# Patient Record
Sex: Female | Born: 1965 | Race: Black or African American | Hispanic: No | Marital: Married | Smoking: Never smoker
Health system: Southern US, Community
[De-identification: ages and names within clinical notes are randomized; demographics above are authoritative.]

## PROBLEM LIST (undated history)

## (undated) DIAGNOSIS — IMO0002 Reserved for concepts with insufficient information to code with codable children: Secondary | ICD-10-CM

## (undated) DIAGNOSIS — R87619 Unspecified abnormal cytological findings in specimens from cervix uteri: Secondary | ICD-10-CM

## (undated) DIAGNOSIS — K754 Autoimmune hepatitis: Secondary | ICD-10-CM

## (undated) DIAGNOSIS — K3184 Gastroparesis: Secondary | ICD-10-CM

## (undated) DIAGNOSIS — I10 Essential (primary) hypertension: Secondary | ICD-10-CM

## (undated) DIAGNOSIS — E079 Disorder of thyroid, unspecified: Secondary | ICD-10-CM

## (undated) HISTORY — DX: Reserved for concepts with insufficient information to code with codable children: IMO0002

## (undated) HISTORY — PX: HERNIA REPAIR: SHX51

## (undated) HISTORY — DX: Unspecified abnormal cytological findings in specimens from cervix uteri: R87.619

## (undated) HISTORY — PX: MYOMECTOMY: SHX85

## (undated) HISTORY — PX: COLONOSCOPY: SHX174

## (undated) HISTORY — PX: THROAT SURGERY: SHX803

## (undated) HISTORY — PX: KNEE SURGERY: SHX244

## (undated) HISTORY — PX: JOINT REPLACEMENT: SHX530

---

## 1999-04-21 ENCOUNTER — Other Ambulatory Visit: Admission: RE | Admit: 1999-04-21 | Discharge: 1999-04-21 | Payer: Self-pay | Admitting: Obstetrics and Gynecology

## 1999-04-22 ENCOUNTER — Encounter (INDEPENDENT_AMBULATORY_CARE_PROVIDER_SITE_OTHER): Payer: Self-pay | Admitting: Specialist

## 1999-04-22 ENCOUNTER — Other Ambulatory Visit: Admission: RE | Admit: 1999-04-22 | Discharge: 1999-04-22 | Payer: Self-pay | Admitting: Obstetrics and Gynecology

## 2000-01-01 ENCOUNTER — Encounter: Payer: Self-pay | Admitting: Emergency Medicine

## 2000-01-01 ENCOUNTER — Emergency Department (HOSPITAL_COMMUNITY): Admission: EM | Admit: 2000-01-01 | Discharge: 2000-01-01 | Payer: Self-pay | Admitting: Emergency Medicine

## 2000-02-06 ENCOUNTER — Inpatient Hospital Stay (HOSPITAL_COMMUNITY): Admission: RE | Admit: 2000-02-06 | Discharge: 2000-02-08 | Payer: Self-pay | Admitting: Obstetrics and Gynecology

## 2000-02-06 ENCOUNTER — Encounter (INDEPENDENT_AMBULATORY_CARE_PROVIDER_SITE_OTHER): Payer: Self-pay | Admitting: Specialist

## 2000-08-10 ENCOUNTER — Encounter (HOSPITAL_BASED_OUTPATIENT_CLINIC_OR_DEPARTMENT_OTHER): Payer: Self-pay | Admitting: General Surgery

## 2000-08-12 ENCOUNTER — Encounter (INDEPENDENT_AMBULATORY_CARE_PROVIDER_SITE_OTHER): Payer: Self-pay | Admitting: *Deleted

## 2000-08-12 ENCOUNTER — Ambulatory Visit (HOSPITAL_COMMUNITY): Admission: RE | Admit: 2000-08-12 | Discharge: 2000-08-12 | Payer: Self-pay | Admitting: General Surgery

## 2000-08-16 ENCOUNTER — Emergency Department (HOSPITAL_COMMUNITY): Admission: EM | Admit: 2000-08-16 | Discharge: 2000-08-16 | Payer: Self-pay | Admitting: Emergency Medicine

## 2000-12-10 ENCOUNTER — Other Ambulatory Visit: Admission: RE | Admit: 2000-12-10 | Discharge: 2000-12-10 | Payer: Self-pay | Admitting: Obstetrics and Gynecology

## 2001-12-12 ENCOUNTER — Other Ambulatory Visit: Admission: RE | Admit: 2001-12-12 | Discharge: 2001-12-12 | Payer: Self-pay | Admitting: Obstetrics and Gynecology

## 2002-12-18 ENCOUNTER — Other Ambulatory Visit: Admission: RE | Admit: 2002-12-18 | Discharge: 2002-12-18 | Payer: Self-pay | Admitting: Obstetrics and Gynecology

## 2003-05-14 ENCOUNTER — Encounter: Admission: RE | Admit: 2003-05-14 | Discharge: 2003-05-14 | Payer: Self-pay | Admitting: Obstetrics and Gynecology

## 2003-12-19 ENCOUNTER — Other Ambulatory Visit: Admission: RE | Admit: 2003-12-19 | Discharge: 2003-12-19 | Payer: Self-pay | Admitting: Obstetrics and Gynecology

## 2004-05-14 ENCOUNTER — Encounter: Admission: RE | Admit: 2004-05-14 | Discharge: 2004-05-14 | Payer: Self-pay | Admitting: Obstetrics and Gynecology

## 2004-05-21 ENCOUNTER — Encounter: Admission: RE | Admit: 2004-05-21 | Discharge: 2004-05-21 | Payer: Self-pay | Admitting: Obstetrics and Gynecology

## 2004-09-05 ENCOUNTER — Inpatient Hospital Stay (HOSPITAL_COMMUNITY): Admission: AD | Admit: 2004-09-05 | Discharge: 2004-09-05 | Payer: Self-pay | Admitting: Obstetrics and Gynecology

## 2004-12-24 ENCOUNTER — Other Ambulatory Visit: Admission: RE | Admit: 2004-12-24 | Discharge: 2004-12-24 | Payer: Self-pay | Admitting: Obstetrics and Gynecology

## 2005-01-01 ENCOUNTER — Encounter: Admission: RE | Admit: 2005-01-01 | Discharge: 2005-01-01 | Payer: Self-pay | Admitting: Obstetrics and Gynecology

## 2005-05-18 ENCOUNTER — Encounter: Admission: RE | Admit: 2005-05-18 | Discharge: 2005-05-18 | Payer: Self-pay | Admitting: Obstetrics and Gynecology

## 2005-11-03 ENCOUNTER — Ambulatory Visit: Payer: Self-pay | Admitting: Gastroenterology

## 2005-11-03 DIAGNOSIS — K219 Gastro-esophageal reflux disease without esophagitis: Secondary | ICD-10-CM

## 2005-11-12 ENCOUNTER — Ambulatory Visit: Payer: Self-pay | Admitting: Gastroenterology

## 2005-12-28 ENCOUNTER — Other Ambulatory Visit: Admission: RE | Admit: 2005-12-28 | Discharge: 2005-12-28 | Payer: Self-pay | Admitting: Obstetrics and Gynecology

## 2006-05-20 ENCOUNTER — Encounter: Admission: RE | Admit: 2006-05-20 | Discharge: 2006-05-20 | Payer: Self-pay | Admitting: Obstetrics and Gynecology

## 2006-10-09 ENCOUNTER — Ambulatory Visit (HOSPITAL_COMMUNITY): Admission: RE | Admit: 2006-10-09 | Discharge: 2006-10-09 | Payer: Self-pay | Admitting: Obstetrics and Gynecology

## 2006-10-09 ENCOUNTER — Encounter (INDEPENDENT_AMBULATORY_CARE_PROVIDER_SITE_OTHER): Payer: Self-pay | Admitting: Obstetrics and Gynecology

## 2006-11-30 ENCOUNTER — Ambulatory Visit: Payer: Self-pay

## 2006-12-29 ENCOUNTER — Ambulatory Visit: Payer: Self-pay | Admitting: Gastroenterology

## 2007-01-14 ENCOUNTER — Ambulatory Visit (HOSPITAL_COMMUNITY): Admission: RE | Admit: 2007-01-14 | Discharge: 2007-01-14 | Payer: Self-pay | Admitting: Gastroenterology

## 2007-01-14 ENCOUNTER — Encounter: Payer: Self-pay | Admitting: Gastroenterology

## 2007-01-20 ENCOUNTER — Ambulatory Visit: Payer: Self-pay | Admitting: Gastroenterology

## 2007-02-02 ENCOUNTER — Ambulatory Visit (HOSPITAL_COMMUNITY): Admission: RE | Admit: 2007-02-02 | Discharge: 2007-02-02 | Payer: Self-pay | Admitting: Gastroenterology

## 2007-03-11 ENCOUNTER — Ambulatory Visit (HOSPITAL_COMMUNITY): Admission: RE | Admit: 2007-03-11 | Discharge: 2007-03-11 | Payer: Self-pay | Admitting: Obstetrics and Gynecology

## 2007-03-11 ENCOUNTER — Encounter (INDEPENDENT_AMBULATORY_CARE_PROVIDER_SITE_OTHER): Payer: Self-pay | Admitting: Obstetrics and Gynecology

## 2007-03-14 ENCOUNTER — Ambulatory Visit: Payer: Self-pay | Admitting: Gastroenterology

## 2007-04-03 ENCOUNTER — Encounter: Admission: RE | Admit: 2007-04-03 | Discharge: 2007-04-03 | Payer: Self-pay | Admitting: Obstetrics and Gynecology

## 2007-04-28 ENCOUNTER — Ambulatory Visit: Payer: Self-pay | Admitting: Gastroenterology

## 2007-05-05 ENCOUNTER — Ambulatory Visit (HOSPITAL_COMMUNITY): Admission: RE | Admit: 2007-05-05 | Discharge: 2007-05-05 | Payer: Self-pay | Admitting: Gastroenterology

## 2007-05-12 DIAGNOSIS — E669 Obesity, unspecified: Secondary | ICD-10-CM | POA: Insufficient documentation

## 2007-05-23 ENCOUNTER — Encounter: Admission: RE | Admit: 2007-05-23 | Discharge: 2007-05-23 | Payer: Self-pay | Admitting: Obstetrics and Gynecology

## 2008-05-23 ENCOUNTER — Encounter: Admission: RE | Admit: 2008-05-23 | Discharge: 2008-05-23 | Payer: Self-pay | Admitting: Obstetrics and Gynecology

## 2008-06-01 ENCOUNTER — Telehealth: Payer: Self-pay | Admitting: Gastroenterology

## 2008-07-26 ENCOUNTER — Ambulatory Visit: Payer: Self-pay | Admitting: Gastroenterology

## 2008-07-26 DIAGNOSIS — K3184 Gastroparesis: Secondary | ICD-10-CM

## 2009-05-24 ENCOUNTER — Encounter: Admission: RE | Admit: 2009-05-24 | Discharge: 2009-05-24 | Payer: Self-pay | Admitting: Obstetrics and Gynecology

## 2010-04-18 ENCOUNTER — Other Ambulatory Visit: Payer: Self-pay | Admitting: Obstetrics and Gynecology

## 2010-04-18 DIAGNOSIS — Z1231 Encounter for screening mammogram for malignant neoplasm of breast: Secondary | ICD-10-CM

## 2010-05-26 ENCOUNTER — Ambulatory Visit
Admission: RE | Admit: 2010-05-26 | Discharge: 2010-05-26 | Disposition: A | Discharge status: Home / Self Care | Payer: Managed Care, Other (non HMO) | Source: Ambulatory Visit | Attending: Obstetrics and Gynecology | Admitting: Obstetrics and Gynecology

## 2010-05-26 DIAGNOSIS — Z1231 Encounter for screening mammogram for malignant neoplasm of breast: Secondary | ICD-10-CM

## 2010-07-22 NOTE — Assessment & Plan Note (Signed)
[facility]                         GASTROENTEROLOGY OFFICE NOTE   NAME:Privette, GLADIES SOFRANKO                        MRN:          [id number]  DATE:04/28/2007                            DOB:          1966/02/21    PROBLEM:  Nausea.   Ms. Lapierre has returned for scheduled followup.  She continues to complain  of nausea, despite erythromycin 500 mg twice a day.  She was unable to  use her Reglan because of lactation while on the mediation.  She  initially had some stomach discomfort with erythromycin, but this has  subsided.  Nausea is fairly constant and she has lost 7 pounds since her  last visit, 6 weeks ago.   PHYSICAL EXAMINATION:  VITAL SIGNS:  Pulse is 68, blood pressure 140/96,  weight 224.   IMPRESSION:  Idiopathic gastroparesis.   RECOMMENDATIONS:  1. Gastroparesis diet.  2. Repeat gastric emptying scan on erythromycin.  3. Try switching erythromycin to 250 mg 4 times a day.  4. Domperidone was discussed, but the patient is reticent to take this      because of a possible side effect of lactation.     Barbette Hair. Arlyce Dice, MD,FACG  Electronically Signed    RDK/MedQ  DD: 04/28/2007  DT: 04/28/2007  Job #: [id number]   cc:   Sigmund Hazel, M.D.

## 2010-07-22 NOTE — Assessment & Plan Note (Signed)
[facility]                         GASTROENTEROLOGY OFFICE NOTE   NAME:Rachel [name], Rachel [name]                        MRN:          [id number]  DATE:12/29/2006                            DOB:          Jan 11, 1966    PROBLEM:  Dysphagia.   The patient has returned for reevaluation.  Over the last few days, she  has developed recurrent dysphagia to solids.  She has a feeling of spasm  in her chest when this occurs as well with pressure in her upper  abdomen.  She denies pyrosis.  She remains on Nexium.  She was dilated  to 18 mm 13 months ago.   PHYSICAL EXAMINATION:  VITAL SIGNS:  Pulse 100, blood pressure 158/88,  weight 228.   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   RECOMMENDATIONS:  1. Repeat dilatation.  We will utilize a balloon dilatation at this      time.  2. Continue on Nexium.     Barbette Hair. Arlyce Dice, MD,FACG  Electronically Signed    RDK/MedQ  DD: 12/29/2006  DT: 12/30/2006  Job #: [id number]   cc:   Sigmund Hazel, M.D.

## 2010-07-22 NOTE — H&P (Signed)
Rachel [name], Rachel [name]                 ACCOUNT NO.:  [id number]   MEDICAL RECORD NO.:  [id number]          PATIENT TYPE:  AMB   LOCATION:  SDC                           FACILITY:  [facility]   PHYSICIAN:  Janine Limbo, M.D.DATE OF BIRTH:  01-31-1966   DATE OF ADMISSION:  10/09/2006  DATE OF DISCHARGE:                              HISTORY & PHYSICAL   Ms. Oravec is a 45 year old female, gravida 2, para 0-0-1-0, who presents  with a first trimester miscarriage.  She has decreasing quantitative HCG  values, and she has an ultrasound that shows a collapsing gestational  sac.   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   DRUG ALLERGIES:  PERCOCET.   PAST MEDICAL HISTORY:  The patient had an abdominal myomectomy in 2001.  She has asthma and currently uses an inhaler.  She has a history of the  human papilloma virus.   SOCIAL HISTORY:  The patient denies cigarette use, alcohol use, and  recreational drug use.   REVIEW OF SYSTEMS:  Noncontributory.   FAMILY HISTORY:  Noncontributory.   PHYSICAL EXAMINATION:  Weight is 235 pounds.  Height is 5 feet 7 inches.  HEENT:  Within normal limits.  CHEST:  Clear.  HEART:  Regular rate and rhythm.  ABDOMEN:  Nontender.  EXTREMITIES:  Grossly normal.  NEUROLOGIC:  Grossly normal.  PELVIC:  External genitalia is normal.  Vagina is normal.  Cervix is  nontender.  Uterus is normal size, shape, and consistency.  Adnexa, no  masses.   LABORATORY VALUES:  Blood type is O+.   ASSESSMENT:  First trimester missed abortion.   PLAN:  The patient will undergo a suction dilatation and evacuation.  She understands the indications for her procedure, and she accepts the  risks of but not limited to anesthetic complications, bleeding,  infection, and possible damage to the surrounding organs.      Janine Limbo, M.D.  Electronically Signed     AVS/MEDQ  D:  10/08/2006  T:  10/08/2006  Job:  [id number]

## 2010-07-22 NOTE — H&P (Signed)
NAMEROSANA, Rachel [name]                 ACCOUNT NO.:  [id number]   MEDICAL RECORD NO.:  [id number]          PATIENT TYPE:  AMB   LOCATION:  SDC                           [facility]ILITY[facility]  [facility]   PHYSICIAN:  Janine Limbo, M.D.DATE OF BIRTH:  11-13-1965   DATE OF ADMISSION:  03/11/2007  DATE OF DISCHARGE:                              HISTORY & PHYSICAL   HISTORY OF PRESENT ILLNESS:  Rachel [name] is a 45 year old female, P0-0-2-0  who presents for hysteroscopy with resection.  She will also have a  dilatation and curettage.  The patient has been followed at the [facility].  The patient  complains of irregular bleeding.  A hydrosonogram was performed and it  showed a uterus that measured 10.67 x 6.86-cm.  Multiple fibroids were  noted measuring less than 5-cm in size.  The endometrium measured 0.62  cm.  The patient was found to have a 1.3 x 1.1-cm mass in the  endometrium that projected into the endometrial cavity.  This was  thought to be either a fibroid or a polyp.  The the patient desires to  have a child.  She had a miscarriage in August 2008.   PAST OBSTETRICAL HISTORY:  In July 2006, the patient had an elective  pregnancy termination at 25 weeks' gestation because she was found to  have an infant with anomalies.  In August 2008 the patient had a first-  trimester miscarriage.   ALLERGIES:  PERCOCET.   PAST MEDICAL HISTORY:  1. Myomectomy in 2001.  2. History of asthma and she currently uses an inhaler.  3. Questionable history of hypertension.   SOCIAL HISTORY:  The patient denies cigarette use, alcohol use and  recreational drug use.   REVIEW OF SYSTEMS:  Noncontributory.   FAMILY HISTORY:  Noncontributory.   PHYSICAL EXAMINATION:  VITAL SIGNS:  Weight is 235 pounds.  Height is 5  feet 7 inches.  HEENT:  Within normal limits.  CHEST:  Chest is clear.  HEART:  Regular rate and rhythm.  ABDOMEN:  Nontender.  EXTREMITIES:   Grossly normal.  NEUROLOGIC:  Grossly normal.  PELVIC:  External genitalia is normal.  Vagina is normal.  Cervix is  nontender.  Uterus is upper limits normal size.  Adnexa no masses.   ASSESSMENT:  1. Irregular bleeding.  2. Fibroid uterus.  3. Endometrial mass consistent with a polyp or a fibroid.   PLAN:  The patient will undergo hysteroscopy with dilatation and  curettage.  We will also resect the mass.  The patient understands the  indications for her surgical procedure and she accepts the risks of, but  not limited to, anesthetic complications, bleeding, infections, and  possible damage to the surrounding organs.      Janine Limbo, M.D.  Electronically Signed     AVS/MEDQ  D:  03/06/2007  T:  03/07/2007  Job:  [id number]

## 2010-07-22 NOTE — Op Note (Signed)
NAMEJAMYLA, Rachel [name]                 ACCOUNT NO.:  [id number]   MEDICAL RECORD NO.:  [id number]          PATIENT TYPE:  AMB   LOCATION:  SDC                           [facility]ILITY:  [facility]   PHYSICIAN:  Janine Limbo, M.D.DATE OF BIRTH:  1966-02-23   DATE OF PROCEDURE:  03/11/2007  DATE OF DISCHARGE:                               OPERATIVE REPORT   PREOPERATIVE DIAGNOSES:  1. Irregular uterine bleeding.  2. Fibroid uterus.  3. Endometrial mass.   POSTOPERATIVE DIAGNOSES:  1. Irregular uterine bleeding.  2. Fibroid uterus.  3. Submucosal fibroids.   PROCEDURES:  1. Hysteroscopy.  2. Hysteroscopic resection of fibroids.  3. Dilatation and curettage.   SURGEON:  Janine Limbo, M.D.   FIRST ASSISTANT:  None.   ANESTHETIC:  Monitored anesthetic control and local 0.5% Marcaine with  epinephrine.   DISPOSITION:  Rachel [name] is a 45 year old female, para 0-0-2-0, who  presents with the above-mentioned diagnoses.  She understands the  indications for her surgical procedure and she accepts the risks of, but  not limited to, anesthetic complications, bleeding, infections, and  possible damage to the surrounding organs.   FINDINGS:  The uterus is approximately 10 weeks' size and irregular.  On  hysteroscopy the patient was found to have two submucosal fibroids with  the largest submucosal fibroid measuring approximately 1.75 cm in size.  No other pathology was noted.   PROCEDURE:  The patient was taken to the operating room, where she was  given medication through her IV line.  The patient's lower abdomen,  perineum, and vagina were prepped with multiple layers of Betadine.  The  bladder was drained of urine.  Examination under anesthesia was  performed.  The patient was sterilely draped.  A paracervical block was  placed using 10 mL of 0.5% Marcaine with epinephrine.  An endocervical  curettage was performed.  The uterus sounded to 8.5 cm.  The cervix was  gently dilated.  The  operative hysteroscope was inserted and the cavity  was carefully inspected.  Pictures were taken..  We used a single loop  to resect the two submucosal fibroids.  We then curetted the uterine  cavity until it was felt to be clean.  The cavity was then explored  using small ring forceps to remove all fragments of fibroids.  The  hysteroscope was reinserted and the cavity was carefully inspected.  The  cavity was felt to be clean at the end of our procedure.  Hemostasis was  noted to be adequate.  The patient was given Toradol 30 mg IV and 30 mg  IM.  Sponge, needle, and instrument counts were correct on two  occasions.  The estimated blood loss for the procedure was 20 mL.  The  estimated fluid deficit was 675 mL, but in the end there was noted to be  approximately 150-200 mL of fluid on the operating room floor.  The  patient was awakened from her anesthetic without difficulty and then  taken to the recovery room in stable condition.  She tolerated her  procedure well.  The endocervical curettage,  the endometrial resections,  and the endometrial curettings were sent to pathology for evaluation.   DISCHARGE MEDICATIONS:  The patient will take the medication she was on  at home.  She will also take Motrin 800 mg every 8 hours as needed for  mild to moderate pain.  She will take Darvocet-N 100 one tablet every 4-  6 hours as needed for severe pain.  She will take potassium chloride 20  mEq each day for 4 days.  Her preoperative potassium was 3.1.  She was  given a copy of the postoperative instruction sheet as prepared by the  [facility] for patients who have undergone a  dilatation and curettage.      Janine Limbo, M.D.  Electronically Signed     AVS/MEDQ  D:  03/11/2007  T:  03/11/2007  Job:  [id number]

## 2010-07-22 NOTE — Assessment & Plan Note (Signed)
[facility]                         GASTROENTEROLOGY OFFICE NOTE   NAME:[name], Rachel BRENNER                        MRN:          [id number]  DATE:03/14/2007                            DOB:          10/15/1965    PROBLEM:  Gastroparesis.   Rachel [name] has returned for scheduled GI followup.  A distal esophageal  stricture was dilated to 18 mm.  Since that time she has had no further  dysphagia.  She has had documented gastroparesis by gastric emptying  scan.  Since starting Reglan she notes marked improvement in her  fullness.  She still is intolerant of popcorn and sodas.  Also again  though she is feeling quite improved.   PHYSICAL EXAMINATION:  VITAL SIGNS:  Pulse 112, blood pressure 154/90,  weight 231.   IMPRESSION:  1. Distal esophageal stricture - asymptomatic following dilatation      therapy.  2. Gastroparesis.   RECOMMENDATIONS:  Continue Reglan.  Her diet is significantly restricted  by certain foods.  Due to her gastroparesis I will repeat the scan to  see whether she still has delayed emptying.  If so then will increase  her Reglan to 20 mg.     Barbette Hair. Arlyce Dice, MD,FACG  Electronically Signed    RDK/MedQ  DD: 03/14/2007  DT: 03/14/2007  Job #: [id number]   cc:   Sigmund Hazel, M.D.

## 2010-07-22 NOTE — Op Note (Signed)
[name], [name]                 ACCOUNT NO.:  [id number]   MEDICAL RECORD NO.:  [id number]          PATIENT TYPE:  AMB   LOCATION:  SDC                           [facility]ILITY:  [facility]   PHYSICIAN:  Janine Limbo, M.D.DATE OF BIRTH:  10/20/1965   DATE OF PROCEDURE:  10/09/2006  DATE OF DISCHARGE:                               OPERATIVE REPORT   PREOPERATIVE DIAGNOSIS:  1. First trimester missed abortion.  This  2. Hypertension.  3. Asthma.  4. Gastroesophageal reflux disease   POSTOPERATIVE DIAGNOSIS:  1. First trimester missed abortion.  This  2. Hypertension.  3. Asthma.  4. Gastroesophageal reflux disease   PROCEDURE:  Suction dilatation and evacuation.   SURGEON:  Janine Limbo, M.D.   FIRST ASSISTANT:  None.   ANESTHESIA:  Monitored anesthetic control and paracervical block using  0.5% Marcaine with epinephrine.   DISPOSITION:  Rachel [name] is a 45 year old female, gravida 2, para 0-0-1-0,  who presents with a missed abortion.  She has decreasing quantitative  HCG values and a nonviable infant on ultrasound.  The patient  understands the indications for her surgical procedure and she accepts  the risks of, but not limited to, anesthetic complications, bleeding,  infection, and possible damage to surrounding organs.  The patient  desires chromosomal analysis.   FINDINGS:  The patient's blood type is O positive.  The patient had a 10-  12 weeks size uterus and a small amount of products of conception were  removed from within the uterine cavity.   PROCEDURE IN DETAIL:  The patient was taken to the operating room where  she was given medication through her IV line.  The patient's perineum  and vagina were prepped with multiple layers of Betadine.  The bladder  was drained of urine.  Examination under anesthesia was performed.  The  patient was sterilely draped.  A paracervical block was placed using 10  mL of 0.5% Marcaine with epinephrine.  An additional 10 mL of  0.5%  Marcaine with epinephrine were injected directly into the cervix.  The  uterus sounded to 13 cm.  The cervix was gradually dilated.  The uterine  cavity was evacuated using a size 8 suction curet followed by a medium  sharp curet.  The cavity was felt to be clean at end of our procedure.  Hemostasis was noted to be adequate.  All instruments were removed.  Repeat examination showed a firm uterus approximately 10-12 weeks' in  size.  The patient tolerated her procedure well.  She was taken to the  recovery room in stable condition.   FOLLOW UP INSTRUCTIONS:  The patient was given a prescription for  ibuprofen and she will take 600 mg every 6 hours as needed for mild to  moderate pain.  She will take Darvocet 1 tablet every 4 hours as needed  for severe pain.  She will return to see Dr. Stefano Gaul in 2-3 weeks for  follow up examination.  She was given a copy of the postoperative  instruction sheet as prepared by the [facility] for  patients  who have undergone a dilatation and curettage.  The products of  conception were sent to pathology for evaluation and a sample was sent  for chromosomal analysis, as well.      Janine Limbo, M.D.  Electronically Signed     AVS/MEDQ  D:  10/09/2006  T:  10/09/2006  Job:  [id number]

## 2010-07-25 NOTE — Letter (Signed)
November 03, 2005     Ms. [facility]  [address]  [place], [place]  [address]   RE:  SIMMIE, GARIN  MRN:  [id number]  /  DOB:  23-Oct-1965   Dear Ms. Colleran:   It is my pleasure to have treated you recently as a new patient in my  office.  I appreciate your confidence and the opportunity to participate in  your care.   Since I do have a busy inpatient endoscopy schedule and office schedule, my  office hours vary weekly.  I am, however, available for emergency calls  every day through my office.  If I cannot promptly meet an urgent office  appointment, another one of our gastroenterologists will be able to assist  you.   My well-trained staff are prepared to help you at all times.  For  emergencies after office hours, a physician from our gastroenterology  section is always available through my 24-hour answering service.   While you are under my care, I encourage discussion of your questions and  concerns, and I will be happy to return your calls as soon as I am  available.   Once again, I welcome you as a new patient and I look forward to a happy and  healthy relationship.   Sincerely,     Barbette Hair. Arlyce Dice, MD, [facility]   RDK/MedQ  DD:  11/03/2005 DT:  11/03/2005 Job #:  [id number]

## 2010-07-25 NOTE — Discharge Summary (Signed)
[facility]  Patient:    Rachel [name], Rachel [name]                       MRN: [id number] Adm. Date:  [id number] Disc. Date: [id number] Attending:  Leonard Schwartz                           Discharge Summary  DISCHARGE DIAGNOSES:          1. Twenty-week fibroid uterus.                               2. Menorrhagia.                               3. Postoperative anemia.                               4. Asthma.  PROCEDURES:                   February 06, 2000:  Multiple abdominal myomectomy.  HISTORY OF PRESENT ILLNESS:   [name] is a 45 year old female, para 0, who presents with a fibroid uterus that was noted to be 15-week size in 2000 and then confirmed to be 20-week size in 2001. Please see her dictated history and physical exam for details.  PAST MEDICAL HISTORY:         The patient has asthma and she uses an inhaler as needed.  PHYSICAL EXAMINATION:         The patient was noted to have a 20-week size irregular firm uterus.  HOSPITAL COURSE:              On the day of admission the patient underwent a multiple abdominal myomectomy. Operative findings included a 20-week sized fibroid uterus. A total of 10 fibroids were removed and the largest fibroid measured 10 cm in diameter. The fallopian tubes and the ovaries were normal. The estimated blood loss during the surgery was 500 cc. The patient tolerated her surgery well. Her postoperative hemoglobin was 10.3 (preoperative hemoglobin 12.8). The patient quickly tolerated a regular diet. She remained afebrile throughout her postoperative stay. By postoperative day #2 she was felt to be ready for discharge.  DISCHARGE INSTRUCTIONS:       The patient will return to see Dr. Stefano Gaul in six weeks for follow-up examination. She will refrain from driving for two weeks, heavy lifting for four weeks, and intercourse for six weeks. She will call for questions or concerns. She will call for temperature  greater than 101. She was given a copy of the postoperative instruction sheet as prepared per [facility].  FINAL PATHOLOGY REPORT:       Pending. DD:  02/08/00 TD:  02/08/00 Job: [id number]

## 2010-07-25 NOTE — Op Note (Signed)
[place]  Patient:    MARGUERETTE, SHELLER                       MRN: [id number] Proc. Date: 08/12/00 Adm. Date:  [id number] Attending:  Sonda Primes                           Operative Report  PREOPERATIVE DIAGNOSIS:  Ventral (incisional) hernia.  POSTOPERATIVE DIAGNOSIS:  Ventral (incisional) hernia.  PROCEDURE:  Repair of incisional hernia with mesh.  SURGEON:  Mardene Celeste. Lurene Shadow, M.D.  ASSISTANT:  Nurse.  ANESTHESIA:  General.  CLINICAL NOTE:  The patient is a 45 year old woman status post previous lower abdominal surgery, who presents now with a painful and enlarging mass inferior to and extending up to the region of the umbilicus, which on examination is a ventral hernia.  She is brought to the operating room now for repair.  DESCRIPTION OF PROCEDURE:  Following the induction of satisfactory general anesthesia with the patient positioned supinely, the abdomen is routinely prepped and draped to be included in a sterile operative field.  I made an incision through the old scar and cicatrix, extending up to and above the umbilicus.  This was deepened through the subcutaneous tissue, carrying the dissection down to the hernia sac, which was freed up on all sides.  It went to the right lateral aspect of the umbilicus, requiring [place] to elevate the umbilicus so as to get to the fascia on that side.  The entire hernia sac was dissected free, opened, and the contents within the hernia sac were reduced into the peritoneal cavity.  I used a Seprafilm slurry to separate the viscera from the mesh, and then I used a large mesh plug to fill the defect.  This was sewn into the fascia with a running suture of #1 Novofil, carried around the entire fascial edge.  The repair was noted to be intact.  The sponge, instrument, and sharp counts were verified.  Subcutaneous tissues were reapproximated with interrupted sutures of 2-0 Vicryl, and the skin  was closed with a running 4-0 Monocryl suture and then reinforced with Steri-Strips. Sterile dressings were applied.  Anesthetic reversed.  Patient removed from the operating room to the recovery room in stable condition, having tolerated this procedure well. DD:  08/12/00 TD:  08/12/00 Job: [id number]

## 2010-07-25 NOTE — Op Note (Signed)
[facility]  Patient:    Rachel [name], Rachel [name]                       MRN: [id number] Proc. Date: 02/06/00 Adm. Date:  [id number] Attending:  Leonard Schwartz                           Operative Report  PREOPERATIVE DIAGNOSES:       1. Enlarging fibroids - 20 weeks.                               2. Menorrhagia.  POSTOPERATIVE DIAGNOSES:      1. Enlarging fibroids - 20 weeks.                               2. Menorrhagia.  OPERATION:                    Abdominal myomectomy.  SURGEON:                      Janine Limbo, M.D.  FIRST ASSISTANT:              Henreitta Leber, P.A.  ANESTHESIA:                   General.  DISPOSITION:                  The patient is a 45 year old female, gravida 0, who presents with a rapidly enlarging fibroid uterus.  Her uterus was noted to be _______ size one year ago, and now ultrasound confirms a 20-week size uterus.  The patient understands the indications for her procedure, and she accepts the risks of, but not limited to anesthetic complications, bleeding, infections, and possible damage to the surrounding organs.  FINDINGS:                     The uterus was noted to be 20 week size and there were multiple fibroids present.  A total of 10 fibroids were removed. The larger fibroid measured 10 cm in diameter.  The fallopian tubes and ovaries appeared normal.  The fallopian tubes were delicate at the fimbriated end.  DESCRIPTION OF PROCEDURE:     The patient was taken to the operating room where a general anesthetic was given.  The patients abdomen, perineum, and vagina were prepped with multiple layers of Betadine.  Examination under anesthesia was performed.  A Foley catheter was placed in the bladder.  The patient was sterilely draped.  A midline incision was made and carried sharply through the subcutaneous tissue, the fascia, and the anterior peritoneum.  The incision was extended to the left of the  umbilicus.  The uterus was elevated into our operative field.  The largest of the fibroids were injected with Pitressin and saline.  Each fibroid was then subsequently incised on the serosal surface and then removed using a combination of sharp and blunt dissection.  ________ in the uterus were closed using deep sutures of 0 Vicryl and then the serosa was closed using a running suture of 3-0 Vicryl. The largest fibroid was present in the fundus.  Several fibroids were removed from the anterior portion.  If this patient needs to deliver in the future, we  recommend a cesarean delivery because of the extensive dissection.  Care was taken not to damage the fallopian tubes.  At the end of our procedure, hemostasis was noted to be adequate.  Interceed was then stitched above incisions.  The pelvic cavity was irrigated.  All instruments were removed.  The anterior peritoneum and the abdominal musculature and the fascia were closed using two running sutures of 0 PDS from the corners to the midline.  The subcutaneous layer was irrigated. Hemostasis was adequate.  The subcutaneous layer was closed using 3-0 Vicryl. The skin was reapproximated using skin staples.  Sponge, needle, and instrument counts were correct on two occasions.  The estimated blood loss was 500 cc.  The patient was noted to drain clear yellow urine at the end of her procedure.  Her anesthetic was reversed and the patient was taken to the recovery room in stable condition. DD:  02/08/00 TD:  02/08/00 Job: [id number]

## 2010-07-25 NOTE — H&P (Signed)
[facility]  Patient:    Rachel [name], Rachel [name]                         MRN: [id number] Adm. Date:  [id number] Disc. Date: [id number] Attending:  Shelba Flake                         History and Physical  HISTORY OF PRESENT ILLNESS:   The patient is a 45 year old female, gravida 0, who presents for an abdominal myomectomy. The patient has been followed at [facility] for enlarging fibroids. An ultrasound was performed in October of 2001 which showed a 19.9 x 13.6 cm multifibroid uterus. In 2000, the patients uterus was noted to be 15.2 cm in size. The patient has menorrhagia. Her endometrial biopsy was negative. Her most recent Pap smear of September of 2001 was within normal limits. The patient has taken birth control pills to help to control her symptoms. but they were unsuccessful. The patient has a history of HPV. She denies any other GYN infections. She denies any prior GYN surgery.  PAST MEDICAL HISTORY:         The patient has a history of asthma and she uses an inhaler. She did take prednisone earlier in the month but is no longer taking this medication. She denies hypertension and diabetes.  SOCIAL HISTORY:               The patient works for [employer]. She is single. She drinks alcohol socially. She denies cigarette use and other recreational drug uses.  ALLERGIES:                    No known drug allergies.  FAMILY HISTORY:               The patients mother and sister have diabetes. The patients sister, mother, and brother have hypertension. The patients father had colon cancer.  REVIEW OF SYSTEMS:            The patient wears glasses, has achy joints, and has shortness of breath associated with her asthma.  PHYSICAL EXAMINATION:  VITAL SIGNS:                  Weight is 233 pounds, height is 5 feet 7 inches.  HEENT:                        Within normal limits.  CHEST:                         Clear.  HEART:                        Regular rate and rhythm.  BREASTS:                      Without masses.  BACK:                         Within normal limits.  EXTREMITIES:                  Within normal limits.  NEUROLOGIC:                   Normal.  ABDOMEN:  Nontender and there is a nontender firm mass present to the umbilicus.  PELVIC:                       External genitalia normal. Vagina is normal. Cervix is nontender. The uterus is 20 week size, irregular and firm. Adnexa no masses. Rectovaginal exam confirms.  ASSESSMENT:                   1. Fibroid uterus.                               2. Menorrhagia.  PLAN:                         The patient will undergo an abdominal myomectomy. She understands the indications for her procedure and she accepts the risk of, but not limited to anesthetic complications, bleeding, infections and possible damage to the surrounding organs. DD:  02/05/00 TD:  02/05/00 Job: [id number]

## 2010-07-25 NOTE — Assessment & Plan Note (Signed)
[facility]                           GASTROENTEROLOGY OFFICE NOTE   NAME:Starling, DONNE ROBILLARD                        MRN:          [id number]  DATE:11/03/2005                            DOB:          15-Feb-1966    REASON FOR CONSULTATION:  Dysphagia.   REFERRING PHYSICIAN:  Dr. Maurice March and Patton Salles.   Rachel [name] is a pleasant 45 year old African-American female referred through  the courtesy of Dr. Maurice March and Patton Salles for evaluation.  Over the past  year she has been complaining of dysphagia to solids.  With the dysphagia,  she has some mild chest pain as well.  She has occasional reflux.  She has  been taking Nexium.  She denies sore throat, hoarseness, or cough.   PAST MEDICAL HISTORY:  1. Hypertension.  2. She has asthma.  3. She is status post herniorrhaphy.   FAMILY HISTORY:  Noncontributory.   MEDICATIONS:  Methyldopa, Singulair, Nexium.   ALLERGIES:  PERCOCET.   She neither smokes or drinks.  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   REVIEW OF SYSTEMS:  Positive for joint pains and occasional dyspnea on  exertion.   PHYSICAL EXAMINATION:  GENERAL:  She is a healthy-appearing female.  VITAL SIGNS:  Pulse 80, blood pressure 150/102, weight 217.  HEENT: EOMI. PERRLA. Sclerae are anicteric.  Conjunctivae are pink.  NECK:  Supple without thyromegaly, adenopathy or carotid bruits.  CHEST:  Clear to auscultation and percussion without adventitious sounds.  CARDIAC:  Regular rhythm; normal S1 S2.  There are no murmurs, gallops or  rubs.  ABDOMEN:  Bowel sounds are normoactive.  Abdomen is soft, non-tender and non-  distended.  There are no abdominal masses, tenderness, splenic enlargement  or hepatomegaly.  EXTREMITIES:  Full range of motion.  No cyanosis, clubbing or edema.  RECTAL:  Deferred.   IMPRESSION:  1. Dysphagia likely secondary to a peptic esophageal stricture.  2. Gastroesophageal reflux disease, well  controlled with Nexium.  3. Hypertension.   RECOMMENDATION:  1. Continue Nexium.  2. Upper endoscopy with Savary dilatation.  3. Follow for hypertension with Dr. Hyacinth Meeker.                                   Barbette Hair. Arlyce Dice, MD, [facility]   RDK/MedQ  DD:  11/03/2005  DT:  11/03/2005  Job #:  [id number]   cc:   Sigmund Hazel, MD

## 2010-07-25 NOTE — Letter (Signed)
November 03, 2005     Sigmund Hazel, M.D.  [address], [address]  [place], [place]  [address]   RE:  [name], [name]  MRN:  [id number]  /  DOB:  [birth year]   Dear Dr. Hyacinth Meeker:   Upon your kind referral, I had the pleasure of evaluating your patient and I  am pleased to offer my findings.  I saw [name] in the office today.  Enclosed is a copy of my progress note that details my findings and  recommendations.   Thank you for the opportunity to participate in your patient's care.    Sincerely,      Rachel Hair. Arlyce Dice, MD, [facility]   RDK/MedQ  DD:  11/03/2005  DT:  11/03/2005  Job #:  [id number]

## 2010-12-12 LAB — CBC
HCT: 38.7
MCHC: 33.8
MCV: 86
Platelets: 312
RBC: 4.5
WBC: 9

## 2010-12-12 LAB — URINALYSIS, ROUTINE W REFLEX MICROSCOPIC
Hgb urine dipstick: NEGATIVE
Protein, ur: NEGATIVE
Specific Gravity, Urine: 1.025
pH: 5.5

## 2010-12-12 LAB — BASIC METABOLIC PANEL
BUN: 10
CO2: 27
Chloride: 102

## 2010-12-22 LAB — URINALYSIS, ROUTINE W REFLEX MICROSCOPIC
Bilirubin Urine: NEGATIVE
Glucose, UA: NEGATIVE
Hgb urine dipstick: NEGATIVE
Ketones, ur: NEGATIVE
Protein, ur: NEGATIVE
Urobilinogen, UA: 0.2
pH: 8

## 2010-12-22 LAB — CBC
Hemoglobin: 12.1
Platelets: 291
RBC: 4.18

## 2011-04-21 ENCOUNTER — Other Ambulatory Visit: Payer: Self-pay | Admitting: Obstetrics and Gynecology

## 2011-04-21 DIAGNOSIS — Z1231 Encounter for screening mammogram for malignant neoplasm of breast: Secondary | ICD-10-CM

## 2011-05-27 ENCOUNTER — Ambulatory Visit
Admission: RE | Admit: 2011-05-27 | Discharge: 2011-05-27 | Disposition: A | Discharge status: Home / Self Care | Payer: Private Health Insurance - Indemnity | Source: Ambulatory Visit | Attending: Obstetrics and Gynecology | Admitting: Obstetrics and Gynecology

## 2011-05-27 DIAGNOSIS — Z1231 Encounter for screening mammogram for malignant neoplasm of breast: Secondary | ICD-10-CM

## 2012-01-13 ENCOUNTER — Encounter: Payer: Self-pay | Admitting: Obstetrics and Gynecology

## 2012-01-13 ENCOUNTER — Ambulatory Visit (INDEPENDENT_AMBULATORY_CARE_PROVIDER_SITE_OTHER): Payer: 59 | Admitting: Obstetrics and Gynecology

## 2012-01-13 VITALS — BP 134/82 | Ht 67.0 in | Wt 252.0 lb

## 2012-01-13 DIAGNOSIS — N898 Other specified noninflammatory disorders of vagina: Secondary | ICD-10-CM

## 2012-01-13 DIAGNOSIS — Z01419 Encounter for gynecological examination (general) (routine) without abnormal findings: Secondary | ICD-10-CM

## 2012-01-13 DIAGNOSIS — Z124 Encounter for screening for malignant neoplasm of cervix: Secondary | ICD-10-CM

## 2012-01-13 DIAGNOSIS — L293 Anogenital pruritus, unspecified: Secondary | ICD-10-CM

## 2012-01-13 DIAGNOSIS — IMO0002 Reserved for concepts with insufficient information to code with codable children: Secondary | ICD-10-CM | POA: Insufficient documentation

## 2012-01-13 LAB — POCT WET PREP (WET MOUNT)

## 2012-01-13 MED ORDER — FLUCONAZOLE 150 MG PO TABS: 150.0000 mg | ORAL_TABLET | Freq: Once | ORAL | Status: DC

## 2012-01-13 NOTE — Progress Notes (Signed)
ANNUAL GYNECOLOGIC EXAMINATION   Rachel [name] is a 46 y.o. female, G2P2, who presents for an annual exam. The patient's sister has developed multiple myeloma and multiple sclerosis.  The patient has a history of an ASCUS Pap smear with HPV.  She complains of itching from the vagina.  She complains of menopausal symptoms.   History   Social History  . Marital Status: Married    Spouse Name: N/A    Number of Children: N/A  . Years of Education: N/A   Social History Main Topics  . Smoking status: Never Smoker   . Smokeless tobacco: Never Used  . Alcohol Use: Yes  . Drug Use: No  . Sexually Active: Yes -- Female partner(s)    Birth Control/ Protection: IUD   Other Topics Concern  . None   Social History Narrative  . None    Menstrual cycle:   LMP: No LMP recorded. Patient is not currently having periods (Reason: IUD).             The following portions of the patient's history were reviewed and updated as appropriate: allergies, current medications, past family history, past medical history, past social history, past surgical history and problem list.  Review of Systems Pertinent items are noted in HPI. Breast:Negative for breast lump,nipple discharge or nipple retraction Gastrointestinal: Negative for abdominal pain, change in bowel habits or rectal bleeding Urinary:negative   Objective:    Ht 5\' 7"  (1.702 m)  Wt 252 lb (114.306 kg)  BMI 39.47 kg/m2    Weight:  Wt Readings from Last 1 Encounters:  01/13/12 252 lb (114.306 kg)          BMI: Body mass index is 39.47 kg/(m^2).  General Appearance: Alert, appropriate appearance for age. No acute distress HEENT: Grossly normal Neck / Thyroid: Supple, no masses, nodes or enlargement Lungs: clear to auscultation bilaterally Back: No CVA tenderness Breast Exam: No masses or nodes.No dimpling, nipple retraction or discharge. Cardiovascular: Regular rate and rhythm. S1, S2, no murmur Gastrointestinal: Soft, non-tender, no  masses or organomegaly  ++++++++++++++++++++++++++++++++++++++++++++++++++++++++  Pelvic Exam: External genitalia: normal general appearance Vaginal: normal without tenderness, induration or masses. Relaxation: No.  Thick white discharge present. Cervix: normal appearance Adnexa: normal bimanual exam Uterus: normal size, shape, and consistency Rectovaginal: normal rectal, no masses  ++++++++++++++++++++++++++++++++++++++++++++++++++++++++  Lymphatic Exam: Non-palpable nodes in neck, clavicular, axillary, or inguinal regions Neurologic: Normal speech, no tremor  Psychiatric: Alert and oriented, appropriate affect.   Wet prep: Yeast  Assessment:    Normal gyn exam   Overweight or obese: Yes   Pelvic relaxation: No  Yeast vaginitis  Obesity  Hypertension  History of ascus Pap with HPV  Sister with multiple sclerosis and multiple myeloma   Plan:    mammogram pap smear return annually or prn Contraception:no method    Medications prescribed: Diflucan 150 mg  STD screen request: No   The updated Pap smear screening guidelines were discussed with the patient. The patient requested that I obtain a Pap smear: Yes.  Kegel exercises discussed: No.  Proper diet and regular exercise were reviewed.  Annual mammograms recommended starting at age 13. Proper breast care was discussed.  Screening colonoscopy is recommended beginning at age 22.  Regular health maintenance was reviewed.  Sleep hygiene was discussed.  Adequate calcium and vitamin D intake was emphasized.  Leonard Schwartz M.D.    Regular Periods: no Mammogram: 03.2013 WNL  Monthly Breast Ex.: yes Exercise: yes  Tetanus < 10  years: no Seatbelts: yes  NI. Bladder Functn.: yes Abuse at home: no  Daily BM's: yes Stressful Work: no  Healthy Diet: yes Sigmoid-Colonoscopy: 2013 due 2015  Calcium: yes Medical problems this year: Vaginal Itching.    LAST PAP:05/2010 WNL   Contraception:  Mirena  Mammogram:  05/2011  PCP: Dr.Duran   PMH: No Changes   FMH: sister diagnosed w/ precancerous cells in her blood and MS.   Last Bone Scan: 2012

## 2012-01-15 LAB — PAP IG W/ RFLX HPV ASCU

## 2012-01-26 ENCOUNTER — Encounter: Payer: Self-pay | Admitting: Obstetrics and Gynecology

## 2012-04-04 ENCOUNTER — Telehealth: Payer: Self-pay | Admitting: Obstetrics and Gynecology

## 2012-04-04 NOTE — Telephone Encounter (Signed)
Tc to pt rgd appt request. Pt has appt scheduled with EP on 04/07/12 but would like something sooner. Advised pt that this is the first available but that she can be put on wait list in case there are any cancellations. Pt voiced understanding.

## 2012-04-07 ENCOUNTER — Ambulatory Visit: Payer: Private Health Insurance - Indemnity | Admitting: Obstetrics and Gynecology

## 2012-04-07 ENCOUNTER — Encounter: Payer: Self-pay | Admitting: Obstetrics and Gynecology

## 2012-04-07 VITALS — BP 122/70 | Temp 98.7°F | Wt 241.0 lb

## 2012-04-07 DIAGNOSIS — R350 Frequency of micturition: Secondary | ICD-10-CM

## 2012-04-07 DIAGNOSIS — Z113 Encounter for screening for infections with a predominantly sexual mode of transmission: Secondary | ICD-10-CM

## 2012-04-07 DIAGNOSIS — N898 Other specified noninflammatory disorders of vagina: Secondary | ICD-10-CM

## 2012-04-07 LAB — POCT WET PREP (WET MOUNT)
Whiff Test: NEGATIVE
pH: 4.5
pH: 4.5

## 2012-04-07 LAB — POCT URINALYSIS DIPSTICK: Spec Grav, UA: 1.02

## 2012-04-07 LAB — POCT OSOM TRICHOMONAS RAPID TEST: Trichomonas vaginalis: NEGATIVE

## 2012-04-07 MED ORDER — ALPRAZOLAM 0.25 MG PO TABS: ORAL_TABLET | ORAL | Status: DC

## 2012-04-07 MED ORDER — TERCONAZOLE 0.8 % VA CREA: 1.0000 | TOPICAL_CREAM | Freq: Every day | VAGINAL | Status: DC

## 2012-04-07 NOTE — Progress Notes (Addendum)
47 YO with a history of HPV was treated for yeast with Diflucan in November but returns with complaints of vaginal itching.  Patient has had some vasomotor symptoms and is using a Mirena IUD.  Patient goes on to say that her husband has been unfaithful but she's in counseling. Also reports that her mother passed away around Thanksgiving and is still grieving. Patient denies suicidal or homocidal ideations but is having difficulty with sleeping and panic attacks.  O: Pelvic: EGBUS-negative, vagina-small clear discharge, cervix-string visible, uterus-ULNS, irregular without tenderness, adnexae-no      masses or tenderness  Wet Prep: ph-4.5,  whiff-negative few yeast OSOM-trich = negative  A; STD Testing      Grief      Spousal Infidelity      Anxiety Attacks      Yeast Vaginitis   P: Recommended Grief Counseling through Hospice     Continue marriage counseling via EAP       STD testing       Terazol 3  1 appl pv qhs x 3 nights        Xanax 0.025 mg  #30  1 po tid  prn no refills       RTO-as scheduled or prn  Lige Lakeman, PA-C

## 2012-04-08 LAB — HSV 2 ANTIBODY, IGG: HSV 2 Glycoprotein G Ab, IgG: <0.1 IV

## 2012-04-08 LAB — HSV 1 ANTIBODY, IGG: HSV 1 Glycoprotein G Ab, IgG: 12.19 IV — ABNORMAL HIGH

## 2012-04-11 ENCOUNTER — Telehealth: Payer: Self-pay | Admitting: Obstetrics and Gynecology

## 2012-04-11 NOTE — Telephone Encounter (Signed)
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  Reviewed HSV-1, natural history, transmission and prevalence.  Treatment options available for HSV-1.  Patient has never had a cold sore or related symptoms so declines for now.  Is seeing a counselor for marital discord and states that spouse expressed a willingness to go also.  Rachel Racanelli, PA-C

## 2012-04-19 ENCOUNTER — Other Ambulatory Visit: Payer: Self-pay | Admitting: Obstetrics and Gynecology

## 2012-04-19 DIAGNOSIS — Z1231 Encounter for screening mammogram for malignant neoplasm of breast: Secondary | ICD-10-CM

## 2012-05-27 ENCOUNTER — Ambulatory Visit
Admission: RE | Admit: 2012-05-27 | Discharge: 2012-05-27 | Disposition: A | Discharge status: Home / Self Care | Payer: Private Health Insurance - Indemnity | Source: Ambulatory Visit | Attending: Obstetrics and Gynecology | Admitting: Obstetrics and Gynecology

## 2012-05-27 DIAGNOSIS — Z1231 Encounter for screening mammogram for malignant neoplasm of breast: Secondary | ICD-10-CM

## 2013-04-19 ENCOUNTER — Other Ambulatory Visit: Payer: Self-pay

## 2013-04-19 DIAGNOSIS — Z1231 Encounter for screening mammogram for malignant neoplasm of breast: Secondary | ICD-10-CM

## 2013-05-29 ENCOUNTER — Ambulatory Visit
Admission: RE | Admit: 2013-05-29 | Discharge: 2013-05-29 | Disposition: A | Discharge status: Home / Self Care | Payer: Private Health Insurance - Indemnity | Source: Ambulatory Visit

## 2013-05-29 DIAGNOSIS — Z1231 Encounter for screening mammogram for malignant neoplasm of breast: Secondary | ICD-10-CM

## 2014-01-08 ENCOUNTER — Encounter: Payer: Self-pay | Admitting: Obstetrics and Gynecology

## 2014-03-29 ENCOUNTER — Other Ambulatory Visit: Payer: Self-pay | Admitting: Internal Medicine

## 2014-03-29 DIAGNOSIS — E041 Nontoxic single thyroid nodule: Secondary | ICD-10-CM

## 2014-04-10 ENCOUNTER — Inpatient Hospital Stay
Admission: RE | Admit: 2014-04-10 | Discharge: 2014-04-10 | Disposition: A | Discharge status: Home / Self Care | Payer: Self-pay | Source: Ambulatory Visit | Attending: Interventional Radiology | Admitting: Interventional Radiology

## 2014-04-10 ENCOUNTER — Other Ambulatory Visit (HOSPITAL_COMMUNITY): Payer: Self-pay | Admitting: Interventional Radiology

## 2014-04-10 DIAGNOSIS — E041 Nontoxic single thyroid nodule: Secondary | ICD-10-CM

## 2014-04-11 ENCOUNTER — Ambulatory Visit
Admission: RE | Admit: 2014-04-11 | Discharge: 2014-04-11 | Disposition: A | Discharge status: Home / Self Care | Payer: BLUE CROSS/BLUE SHIELD | Source: Ambulatory Visit | Attending: Internal Medicine | Admitting: Internal Medicine

## 2014-04-11 ENCOUNTER — Encounter (INDEPENDENT_AMBULATORY_CARE_PROVIDER_SITE_OTHER): Payer: Self-pay

## 2014-04-11 ENCOUNTER — Other Ambulatory Visit (HOSPITAL_COMMUNITY)
Admission: RE | Admit: 2014-04-11 | Discharge: 2014-04-11 | Disposition: A | Discharge status: Home / Self Care | Payer: BLUE CROSS/BLUE SHIELD | Source: Ambulatory Visit | Attending: Interventional Radiology | Admitting: Interventional Radiology

## 2014-04-11 DIAGNOSIS — E041 Nontoxic single thyroid nodule: Secondary | ICD-10-CM | POA: Insufficient documentation

## 2014-04-24 ENCOUNTER — Other Ambulatory Visit: Payer: Self-pay

## 2014-04-24 DIAGNOSIS — Z1231 Encounter for screening mammogram for malignant neoplasm of breast: Secondary | ICD-10-CM

## 2014-05-31 ENCOUNTER — Ambulatory Visit
Admission: RE | Admit: 2014-05-31 | Discharge: 2014-05-31 | Disposition: A | Discharge status: Home / Self Care | Payer: BLUE CROSS/BLUE SHIELD | Source: Ambulatory Visit

## 2014-05-31 DIAGNOSIS — Z1231 Encounter for screening mammogram for malignant neoplasm of breast: Secondary | ICD-10-CM

## 2014-09-14 ENCOUNTER — Encounter (HOSPITAL_COMMUNITY): Payer: Self-pay | Admitting: Emergency Medicine

## 2014-09-14 ENCOUNTER — Other Ambulatory Visit: Payer: Self-pay

## 2014-09-14 ENCOUNTER — Emergency Department (HOSPITAL_COMMUNITY)
Admission: EM | Admit: 2014-09-14 | Discharge: 2014-09-14 | Disposition: A | Discharge status: Home / Self Care | Payer: BLUE CROSS/BLUE SHIELD | Attending: Emergency Medicine | Admitting: Emergency Medicine

## 2014-09-14 DIAGNOSIS — K3184 Gastroparesis: Secondary | ICD-10-CM | POA: Diagnosis not present

## 2014-09-14 DIAGNOSIS — Z792 Long term (current) use of antibiotics: Secondary | ICD-10-CM | POA: Diagnosis not present

## 2014-09-14 DIAGNOSIS — R002 Palpitations: Secondary | ICD-10-CM

## 2014-09-14 DIAGNOSIS — E876 Hypokalemia: Secondary | ICD-10-CM | POA: Diagnosis not present

## 2014-09-14 DIAGNOSIS — K219 Gastro-esophageal reflux disease without esophagitis: Secondary | ICD-10-CM | POA: Diagnosis not present

## 2014-09-14 DIAGNOSIS — Z79899 Other long term (current) drug therapy: Secondary | ICD-10-CM | POA: Insufficient documentation

## 2014-09-14 DIAGNOSIS — R1013 Epigastric pain: Secondary | ICD-10-CM | POA: Diagnosis present

## 2014-09-14 LAB — URINALYSIS, ROUTINE W REFLEX MICROSCOPIC
Bilirubin Urine: NEGATIVE
GLUCOSE, UA: NEGATIVE mg/dL
Hgb urine dipstick: NEGATIVE
KETONES UR: NEGATIVE mg/dL
LEUKOCYTES UA: NEGATIVE
Nitrite: NEGATIVE
PROTEIN: NEGATIVE mg/dL
Specific Gravity, Urine: 1.014 (ref 1.005–1.030)
Urobilinogen, UA: 0.2 mg/dL (ref 0.0–1.0)
pH: 5 (ref 5.0–8.0)

## 2014-09-14 LAB — CBC
HEMATOCRIT: 42.9 % (ref 36.0–46.0)
Hemoglobin: 14.5 g/dL (ref 12.0–15.0)
MCH: 28 pg (ref 26.0–34.0)
MCHC: 33.8 g/dL (ref 30.0–36.0)
MCV: 83 fL (ref 78.0–100.0)
PLATELETS: 242 10*3/uL (ref 150–400)
RBC: 5.17 MIL/uL — ABNORMAL HIGH (ref 3.87–5.11)
RDW: 13.6 % (ref 11.5–15.5)
WBC: 6.4 10*3/uL (ref 4.0–10.5)

## 2014-09-14 LAB — COMPREHENSIVE METABOLIC PANEL
ALK PHOS: 58 U/L (ref 38–126)
ALT: 29 U/L (ref 14–54)
ANION GAP: 13 (ref 5–15)
AST: 36 U/L (ref 15–41)
Albumin: 4.5 g/dL (ref 3.5–5.0)
BUN: 10 mg/dL (ref 6–20)
CALCIUM: 10.3 mg/dL (ref 8.9–10.3)
CO2: 25 mmol/L (ref 22–32)
Chloride: 104 mmol/L (ref 101–111)
Creatinine, Ser: 1.01 mg/dL — ABNORMAL HIGH (ref 0.44–1.00)
GLUCOSE: 122 mg/dL — AB (ref 65–99)
Potassium: 3.1 mmol/L — ABNORMAL LOW (ref 3.5–5.1)
SODIUM: 142 mmol/L (ref 135–145)
Total Bilirubin: 0.5 mg/dL (ref 0.3–1.2)
Total Protein: 7.6 g/dL (ref 6.5–8.1)

## 2014-09-14 LAB — CK: CK TOTAL: 254 U/L — AB (ref 38–234)

## 2014-09-14 LAB — LIPASE, BLOOD: LIPASE: 15 U/L — AB (ref 22–51)

## 2014-09-14 MED ORDER — SODIUM CHLORIDE 0.9 % IV SOLN
80.0000 mg | Freq: Once | INTRAVENOUS | Status: AC
  Administered 2014-09-14: 80 mg via INTRAVENOUS
  Filled 2014-09-14: qty 80

## 2014-09-14 MED ORDER — GI COCKTAIL ~~LOC~~
30.0000 mL | Freq: Once | ORAL | Status: AC
  Administered 2014-09-14: 30 mL via ORAL
  Filled 2014-09-14: qty 30

## 2014-09-14 MED ORDER — SODIUM CHLORIDE 0.9 % IV BOLUS (SEPSIS)
1000.0000 mL | Freq: Once | INTRAVENOUS | Status: AC
  Administered 2014-09-14: 1000 mL via INTRAVENOUS

## 2014-09-14 MED ORDER — POTASSIUM CHLORIDE CRYS ER 20 MEQ PO TBCR: 20.0000 meq | EXTENDED_RELEASE_TABLET | Freq: Every day | ORAL | Status: DC

## 2014-09-14 MED ORDER — POTASSIUM CHLORIDE CRYS ER 20 MEQ PO TBCR
40.0000 meq | EXTENDED_RELEASE_TABLET | Freq: Once | ORAL | Status: AC
  Administered 2014-09-14: 40 meq via ORAL
  Filled 2014-09-14: qty 2

## 2014-09-14 NOTE — Discharge Instructions (Signed)
1. Medications: usual home medications 2. Treatment: rest, drink plenty of fluids,  3. Follow Up: Please followup with your primary doctor in at your appointment on Monday for discussion of your diagnoses and further evaluation after today's visit; Please return to the ER for return or worsening of symptoms   Palpitations A palpitation is the feeling that your heartbeat is irregular or is faster than normal. It may feel like your heart is fluttering or skipping a beat. Palpitations are usually not a serious problem. However, in some cases, you may need further medical evaluation. CAUSES  Palpitations can be caused by:  Smoking.  Caffeine or other stimulants, such as diet pills or energy drinks.  Alcohol.  Stress and anxiety.  Strenuous physical activity.  Fatigue.  Certain medicines.  Heart disease, especially if you have a history of irregular heart rhythms (arrhythmias), such as atrial fibrillation, atrial flutter, or supraventricular tachycardia.  An improperly working pacemaker or defibrillator. DIAGNOSIS  To find the cause of your palpitations, your health care provider will take your medical history and perform a physical exam. Your health care provider may also have you take a test called an ambulatory electrocardiogram (ECG). An ECG records your heartbeat patterns over a 24-hour period. You may also have other tests, such as:  Transthoracic echocardiogram (TTE). During echocardiography, sound waves are used to evaluate how blood flows through your heart.  Transesophageal echocardiogram (TEE).  Cardiac monitoring. This allows your health care provider to monitor your heart rate and rhythm in real time.  Holter monitor. This is a portable device that records your heartbeat and can help diagnose heart arrhythmias. It allows your health care provider to track your heart activity for several days, if needed.  Stress tests by exercise or by giving medicine that makes the heart  beat faster. TREATMENT  Treatment of palpitations depends on the cause of your symptoms and can vary greatly. Most cases of palpitations do not require any treatment other than time, relaxation, and monitoring your symptoms. Other causes, such as atrial fibrillation, atrial flutter, or supraventricular tachycardia, usually require further treatment. HOME CARE INSTRUCTIONS   Avoid:  Caffeinated coffee, tea, soft drinks, diet pills, and energy drinks.  Chocolate.  Alcohol.  Stop smoking if you smoke.  Reduce your stress and anxiety. Things that can help you relax include:  A method of controlling things in your body, such as your heartbeats, with your mind (biofeedback).  Yoga.  Meditation.  Physical activity such as swimming, jogging, or walking.  Get plenty of rest and sleep. SEEK MEDICAL CARE IF:   You continue to have a fast or irregular heartbeat beyond 24 hours.  Your palpitations occur more often. SEEK IMMEDIATE MEDICAL CARE IF:  You have chest pain or shortness of breath.  You have a severe headache.  You feel dizzy or you faint. MAKE SURE YOU:  Understand these instructions.  Will watch your condition.  Will get help right away if you are not doing well or get worse. Document Released: 02/21/2000 Document Revised: 02/28/2013 Document Reviewed: 04/24/2011 [facility] Patient Information 2015 [employer] [place], [place]. This information is not intended to replace advice given to you by your health care provider. Make sure you discuss any questions you have with your health care provider.

## 2014-09-14 NOTE — ED Notes (Signed)
[facility] with c/o feeling like her heart was racing and epigastric discomfort. Pt was outside and came inside and started feeling like her heart was racing took ASA 324mg . Initial BP:156/107 HR-125  Last BP-175/89 HR-101. Pt stated that she has never felt like this before but has ben under some stress and has acid reflux issues.

## 2014-09-14 NOTE — ED Provider Notes (Signed)
CSN: [id number]     Arrival date & time 09/14/14  1802 History   First MD Initiated Contact with Patient 09/14/14 1808     Chief Complaint  Patient presents with  . Palpitations  . Abdominal Pain     (Consider location/radiation/quality/duration/timing/severity/associated sxs/prior Treatment) The history is provided by the patient, medical records and the spouse. No language interpreter was used.     Rachel [name] is a 49 y.o. female  with no major medical Hx presents to the Emergency Department complaining of gradual, persistent, progressively worsening palpitations and epigastric discomfort.   Onset one hour prior to arrival.  Pt reports she has not taken her gastroparesis or acid reflux medications and has had 2 cups of coffee today.  She reports drinking no water and eating no food today. She states she was then out in the heat unloading groceries and boxes of water when she began to feel like her heart was racing.  She reports this made her very anxious and she took aspirin 324 mg.  She reports persistent fullness and discomfort in her epigastrium present all day similar to previous episodes of GERD.  She denies abdominal pain, nausea, vomiting, diarrhea, weakness, dizziness, syncope, dysuria, fever, chills, headache, neck pain, chest pain, shortness of breath.  She denies diaphoresis or near syncope.  She reports that she became very anxious about her heart rate and epigastric discomfort prompting her to summon EMS.   Past Medical History  Diagnosis Date  . Abnormal pap   . Abnormal Pap smear    Past Surgical History  Procedure Laterality Date  . Myomectomy    . Knee surgery    . Throat surgery    . Colonoscopy     Family History  Problem Relation Age of Onset  . Heart disease Maternal Grandmother   . Diabetes Maternal Grandmother   . Colon cancer Father   . Heart attack Mother   . Diabetes Mother   . Hypertension Brother   . Multiple sclerosis Sister   . Diabetes Sister     History  Substance Use Topics  . Smoking status: Never Smoker   . Smokeless tobacco: Never Used  . Alcohol Use: Yes   OB History    Gravida Para Term Preterm AB TAB SAB Ectopic Multiple Living   2 2        0     Review of Systems  Constitutional: Negative for fever, diaphoresis, appetite change, fatigue and unexpected weight change.  HENT: Negative for mouth sores.   Eyes: Negative for visual disturbance.  Respiratory: Negative for cough, chest tightness, shortness of breath and wheezing.   Cardiovascular: Positive for palpitations. Negative for chest pain.  Gastrointestinal: Positive for abdominal pain. Negative for nausea, vomiting, diarrhea and constipation.  Endocrine: Negative for polydipsia, polyphagia and polyuria.  Genitourinary: Negative for dysuria, urgency, frequency and hematuria.  Musculoskeletal: Negative for back pain and neck stiffness.  Skin: Negative for rash.  Allergic/Immunologic: Negative for immunocompromised state.  Neurological: Negative for syncope, light-headedness and headaches.  Hematological: Does not bruise/bleed easily.  Psychiatric/Behavioral: Negative for sleep disturbance. The patient is not nervous/anxious.       Allergies  Oxycodone-acetaminophen  Home Medications   Prior to Admission medications   Medication Sig Start Date End Date Taking? Authorizing Provider  ALPRAZolam Prudy Feeler) 0.25 MG tablet 1 po tid prn 04/07/12   Henreitta Leber, PA-C  calcium carbonate 200 MG capsule Take 250 mg by mouth 2 (two) times daily with a meal.  Historical Provider, MD  cholecalciferol (VITAMIN D) 1000 UNITS tablet Take 1,000 Units by mouth daily.    Historical Provider, MD  erythromycin base (E-MYCIN) 500 MG tablet Take 500 mg by mouth 2 (two) times daily.    Historical Provider, MD  esomeprazole (NEXIUM) 40 MG capsule Take 40 mg by mouth daily before breakfast.    Historical Provider, MD  fish oil-omega-3 fatty acids 1000 MG capsule Take 2 g by mouth  daily.    Historical Provider, MD  fluconazole (DIFLUCAN) 150 MG tablet Take 1 tablet (150 mg total) by mouth once. 01/13/12   Kirkland HunArthur Stringer, MD  montelukast (SINGULAIR) 10 MG tablet Take 10 mg by mouth at bedtime.    Historical Provider, MD  nebivolol (BYSTOLIC) 5 MG tablet Take 5 mg by mouth daily.    Historical Provider, MD  olmesartan (BENICAR) 5 MG tablet Take 5 mg by mouth daily.    Historical Provider, MD  PRENATAL VITAMINS PO Take by mouth.    Historical Provider, MD  terconazole (TERAZOL 3) 0.8 % vaginal cream Place 1 applicator vaginally at bedtime. 04/07/12   Elmira Powell, PA-C   BP 159/84 mmHg  Pulse 99  Temp(Src) 98.5 F (36.9 C) (Oral)  Resp 20  SpO2 100% Physical Exam  Constitutional: She appears well-developed and well-nourished. No distress.  Awake, alert, nontoxic appearance  HENT:  Head: Normocephalic and atraumatic.  Mouth/Throat: Oropharynx is clear and moist. No oropharyngeal exudate.  Eyes: Conjunctivae are normal. No scleral icterus.  Neck: Normal range of motion. Neck supple.  Cardiovascular: Normal rate, regular rhythm, normal heart sounds and intact distal pulses.   No murmur heard. Pulmonary/Chest: Effort normal and breath sounds normal. No respiratory distress. She has no wheezes.  Equal chest expansion Clear and equal breath sounds  Abdominal: Soft. Bowel sounds are normal. She exhibits no mass. There is no tenderness. There is no rebound and no guarding.  Soft and nontender  Musculoskeletal: Normal range of motion. She exhibits no edema.  No peripheral edema  Neurological: She is alert.  Speech is clear and goal oriented Moves extremities without ataxia  Skin: Skin is warm and dry. She is not diaphoretic.  Psychiatric: She has a normal mood and affect.  Nursing note and vitals reviewed.   ED Course  Procedures (including critical care time) Labs Review Labs Reviewed  CBC - Abnormal; Notable for the following:    RBC 5.17 (*)    All other  components within normal limits  CK - Abnormal; Notable for the following:    Total CK 254 (*)    All other components within normal limits  COMPREHENSIVE METABOLIC PANEL - Abnormal; Notable for the following:    Potassium 3.1 (*)    Glucose, Bld 122 (*)    Creatinine, Ser 1.01 (*)    All other components within normal limits  LIPASE, BLOOD - Abnormal; Notable for the following:    Lipase 15 (*)    All other components within normal limits  URINALYSIS, ROUTINE W REFLEX MICROSCOPIC (NOT AT [facility])  POC URINE PREG, ED    Imaging Review No results found.   EKG Interpretation None       ED ECG REPORT   Date: 09/14/2014  Rate: 95  Rhythm: normal sinus rhythm  QRS Axis: normal  Intervals: normal  ST/T Wave abnormalities: nonspecific ST changes  Conduction Disutrbances:none  Narrative Interpretation: NSR with nonspecific ST changes; no old for comparison  Old EKG Reviewed: none available  I have personally reviewed  the EKG tracing and agree with the computerized printout as noted.    MDM   Final diagnoses:  Palpitations  Hypokalemia  Gastroparesis  Gastroesophageal reflux disease, esophagitis presence not specified   Essie Hart presents with complaints of palpitations and epigastric discomfort. On exam patient is well-appearing and without tachycardia.  She has a benign abdomen. Will check labs, give fluids and Protonix and reassess.  9:04 PM Patient reports she continues to feel well. No tachycardia will here in the emergency department. Labs reassuring. Potassium 3.1 and repleted here in the emergency department. We'll discharge home with 2 days worth of potassium. She has a primary care appointment on Monday where she can have her labs rechecked. Urinalysis without ketones. Minimal elevation in her CK at 254 likely secondary to the heat. Patient has ambulated here in the emergency department without difficulty.  Symptoms are likely multifactorial including her caffeine  intake, heat and decreased hydration. Discussed all of these things with the patient and family.   BP 159/84 mmHg  Pulse 99  Temp(Src) 98.5 F (36.9 C) (Oral)  Resp 20  SpO2 100%   Dierdre Forth, PA-C 09/15/14 0000  Purvis Sheffield, MD 09/15/14 1600

## 2015-04-23 ENCOUNTER — Other Ambulatory Visit: Payer: Self-pay

## 2015-04-23 DIAGNOSIS — Z1231 Encounter for screening mammogram for malignant neoplasm of breast: Secondary | ICD-10-CM

## 2015-06-03 ENCOUNTER — Ambulatory Visit
Admission: RE | Admit: 2015-06-03 | Discharge: 2015-06-03 | Disposition: A | Discharge status: Home / Self Care | Payer: BLUE CROSS/BLUE SHIELD | Source: Ambulatory Visit

## 2015-06-03 DIAGNOSIS — Z1231 Encounter for screening mammogram for malignant neoplasm of breast: Secondary | ICD-10-CM

## 2016-01-05 ENCOUNTER — Encounter (HOSPITAL_COMMUNITY): Payer: Self-pay | Admitting: Emergency Medicine

## 2016-01-05 ENCOUNTER — Emergency Department (HOSPITAL_COMMUNITY)
Admission: EM | Admit: 2016-01-05 | Discharge: 2016-01-06 | Disposition: A | Discharge status: Home / Self Care | Payer: BLUE CROSS/BLUE SHIELD | Attending: Emergency Medicine | Admitting: Emergency Medicine

## 2016-01-05 ENCOUNTER — Emergency Department (HOSPITAL_COMMUNITY): Payer: BLUE CROSS/BLUE SHIELD

## 2016-01-05 DIAGNOSIS — I4891 Unspecified atrial fibrillation: Secondary | ICD-10-CM | POA: Diagnosis not present

## 2016-01-05 DIAGNOSIS — I10 Essential (primary) hypertension: Secondary | ICD-10-CM | POA: Insufficient documentation

## 2016-01-05 HISTORY — DX: Disorder of thyroid, unspecified: E07.9

## 2016-01-05 HISTORY — DX: Essential (primary) hypertension: I10

## 2016-01-05 LAB — COMPREHENSIVE METABOLIC PANEL
ALT: 18 U/L (ref 14–54)
AST: 30 U/L (ref 15–41)
Albumin: 3.9 g/dL (ref 3.5–5.0)
Alkaline Phosphatase: 65 U/L (ref 38–126)
Anion gap: 7 (ref 5–15)
BUN: 9 mg/dL (ref 6–20)
CHLORIDE: 109 mmol/L (ref 101–111)
CO2: 24 mmol/L (ref 22–32)
Calcium: 9.6 mg/dL (ref 8.9–10.3)
Creatinine, Ser: 0.99 mg/dL (ref 0.44–1.00)
GFR calc Af Amer: >60 mL/min (ref >60)
Glucose, Bld: 126 mg/dL — ABNORMAL HIGH (ref 65–99)
POTASSIUM: 3.8 mmol/L (ref 3.5–5.1)
SODIUM: 140 mmol/L (ref 135–145)
Total Bilirubin: 0.5 mg/dL (ref 0.3–1.2)
Total Protein: 7.2 g/dL (ref 6.5–8.1)

## 2016-01-05 LAB — CBC WITH DIFFERENTIAL/PLATELET
BASOS PCT: 1 %
Basophils Absolute: 0 10*3/uL (ref 0.0–0.1)
Eosinophils Absolute: 0 10*3/uL (ref 0.0–0.7)
Eosinophils Relative: 0 %
HEMATOCRIT: 40.9 % (ref 36.0–46.0)
Hemoglobin: 13.7 g/dL (ref 12.0–15.0)
LYMPHS ABS: 2.5 10*3/uL (ref 0.7–4.0)
LYMPHS PCT: 41 %
MCH: 27.7 pg (ref 26.0–34.0)
MCHC: 33.5 g/dL (ref 30.0–36.0)
MCV: 82.8 fL (ref 78.0–100.0)
MONO ABS: 0.5 10*3/uL (ref 0.1–1.0)
MONOS PCT: 9 %
NEUTROS ABS: 3 10*3/uL (ref 1.7–7.7)
Neutrophils Relative %: 49 %
Platelets: 258 10*3/uL (ref 150–400)
RBC: 4.94 MIL/uL (ref 3.87–5.11)
RDW: 13.8 % (ref 11.5–15.5)
WBC: 6.1 10*3/uL (ref 4.0–10.5)

## 2016-01-05 LAB — I-STAT TROPONIN, ED: TROPONIN I, POC: 0 ng/mL (ref 0.00–0.08)

## 2016-01-05 LAB — PROTIME-INR
INR: 0.9
PROTHROMBIN TIME: 12.2 s (ref 11.4–15.2)

## 2016-01-05 MED ORDER — PROPOFOL 10 MG/ML IV BOLUS
80.0000 mg | Freq: Once | INTRAVENOUS | Status: DC
  Filled 2016-01-05: qty 20

## 2016-01-05 NOTE — ED Provider Notes (Signed)
[facility]-EMERGENCY DEPT Provider Note   CSN: [id number] Arrival date & time: 01/05/16  2147   History   Chief Complaint Chief Complaint  Patient presents with  . Atrial Fibrillation    HPI Rachel [name] is a 50 y.o. female.  HPI   Patient comes in by EMS in new onset atrial fibrillation. She felt her heart beat abnormally starting at 8:30 and noted herself to be hypertensive systolic > 170. EMS arrived and gave her 20 mg of Diltiazem as she was tachycardic between 130-150. After the Dilt her rate improved to 100, she is very anxious. She denies having any history of the same or being told she has abnormal EKGs. She denies having any CP, SOB, LE swelling, back pain, headache, syncope, weakness, confusion, fevers, N/V/D.  Past Medical History:  Diagnosis Date  . Abnormal pap   . Abnormal Pap smear   . Hypertension   . Thyroid disease    "nodule"    Patient Active Problem List   Diagnosis Date Noted  . Abnormal pap   . GASTROPARESIS 07/26/2008  . OBESITY 05/12/2007  . GERD 11/03/2005    Past Surgical History:  Procedure Laterality Date  . COLONOSCOPY    . KNEE SURGERY    . MYOMECTOMY    . THROAT SURGERY      OB History    Gravida Para Term Preterm AB Living   2 2       0   SAB TAB Ectopic Multiple Live Births                   Home Medications    Prior to Admission medications   Medication Sig Start Date End Date Taking? Authorizing Provider  albuterol (PROVENTIL HFA;VENTOLIN HFA) 108 (90 Base) MCG/ACT inhaler Inhale 1-2 puffs into the lungs every 6 (six) hours as needed for wheezing or shortness of breath.   Yes Historical Provider, MD  calcium carbonate 200 MG capsule Take 250 mg by mouth 2 (two) times daily with a meal.   Yes Historical Provider, MD  cholecalciferol (VITAMIN D) 1000 UNITS tablet Take 1,000 Units by mouth daily.   Yes Historical Provider, MD  cycloSPORINE (RESTASIS) 0.05 % ophthalmic emulsion Place 1 drop into both eyes 2 (two) times daily.    Yes Historical Provider, MD  erythromycin base (E-MYCIN) 500 MG tablet Take 500 mg by mouth 2 (two) times daily.   Yes Historical Provider, MD  levothyroxine (SYNTHROID, LEVOTHROID) 75 MCG tablet Take 75 mcg by mouth daily before breakfast.   Yes Historical Provider, MD  levothyroxine (SYNTHROID, LEVOTHROID) 88 MCG tablet Take 88 mcg by mouth daily before breakfast.   Yes Historical Provider, MD  montelukast (SINGULAIR) 10 MG tablet Take 10 mg by mouth at bedtime.   Yes Historical Provider, MD  nebivolol (BYSTOLIC) 5 MG tablet Take 5 mg by mouth daily.   Yes Historical Provider, MD  olmesartan (BENICAR) 5 MG tablet Take 5 mg by mouth daily.   Yes Historical Provider, MD  saccharomyces boulardii (FLORASTOR) 250 MG capsule Take 250 mg by mouth 2 (two) times daily.   Yes Historical Provider, MD  ALPRAZolam Prudy Feeler) 0.25 MG tablet 1 po tid prn 04/07/12   Henreitta Leber, PA-C  esomeprazole (NEXIUM) 40 MG capsule Take 40 mg by mouth daily before breakfast.    Historical Provider, MD  fish oil-omega-3 fatty acids 1000 MG capsule Take 2 g by mouth daily.    Historical Provider, MD  fluconazole (DIFLUCAN) 150 MG tablet  Take 1 tablet (150 mg total) by mouth once. 01/13/12   Kirkland HunArthur Stringer, MD  metoprolol succinate (TOPROL-XL) 25 MG 24 hr tablet Take 0.5 tablets (12.5 mg total) by mouth 2 (two) times daily. 01/06/16   Jolita Haefner Neva SeatGreene, PA-C  potassium chloride SA (K-DUR,KLOR-CON) 20 MEQ tablet Take 1 tablet (20 mEq total) by mouth daily. 09/14/14   Hannah Muthersbaugh, PA-C  PRENATAL VITAMINS PO Take by mouth.    Historical Provider, MD  terconazole (TERAZOL 3) 0.8 % vaginal cream Place 1 applicator vaginally at bedtime. Patient not taking: Reported on 01/05/2016 04/07/12   Henreitta LeberElmira Powell, PA-C    Family History Family History  Problem Relation Age of Onset  . Heart disease Maternal Grandmother   . Diabetes Maternal Grandmother   . Colon cancer Father   . Heart attack Mother   . Diabetes Mother   .  Hypertension Brother   . Multiple sclerosis Sister   . Diabetes Sister     Social History Social History  Substance Use Topics  . Smoking status: Never Smoker  . Smokeless tobacco: Never Used  . Alcohol use Yes     Allergies   Oxycodone-acetaminophen   Review of Systems Review of Systems Review of Systems All other systems negative except as documented in the HPI. All pertinent positives and negatives as reviewed in the HPI.   Physical Exam Updated Vital Signs BP 122/85   Pulse 87   Temp 98.2 F (36.8 C) (Oral)   Resp 13   SpO2 98%   Physical Exam  Constitutional: She appears well-developed and well-nourished. No distress.  HENT:  Head: Normocephalic and atraumatic.  Right Ear: Tympanic membrane and ear canal normal.  Left Ear: Tympanic membrane and ear canal normal.  Nose: Nose normal.  Mouth/Throat: Uvula is midline, oropharynx is clear and moist and mucous membranes are normal.  Eyes: Pupils are equal, round, and reactive to light.  Neck: Normal range of motion. Neck supple.  Cardiovascular: An irregularly irregular rhythm present. Tachycardia present.   Pulmonary/Chest: Effort normal.  Abdominal: Soft.  No signs of abdominal distention  Musculoskeletal:  No LE swelling  Neurological: She is alert.  Acting at baseline  Skin: Skin is warm and dry. No rash noted.  Nursing note and vitals reviewed.   ED Treatments / Results  Labs (all labs ordered are listed, but only abnormal results are displayed) Labs Reviewed  COMPREHENSIVE METABOLIC PANEL - Abnormal; Notable for the following:       Result Value   Glucose, Bld 126 (*)    All other components within normal limits  CBC WITH DIFFERENTIAL/PLATELET  Beverlee NimsROTIME-INR  I-STAT TROPOININ, ED    EKG  EKG Interpretation  Date/Time:  Monday January 06 2016 00:10:40 EDT Ventricular Rate:  90 PR Interval:    QRS Duration: 78 QT Interval:  366 QTC Calculation: 448 R Axis:   54 Text Interpretation:  Sinus  rhythm Baseline wander in lead(s) V3 Confirmed by Wilkie AyeHORTON  MD, COURTNEY ([id number]) on 01/06/2016 12:15:35 AM       Radiology Dg Chest 2 View  Result Date: 01/05/2016 CLINICAL DATA:  New onset atrial fibrillation tonight EXAM: CHEST  2 VIEW COMPARISON:  None. FINDINGS: The lungs are clear. The pulmonary vasculature is normal. Heart size is normal. Hilar and mediastinal contours are unremarkable. There is no pleural effusion. IMPRESSION: No active cardiopulmonary disease. Electronically Signed   By: Ellery Plunkaniel R Mitchell M.D.   On: 01/05/2016 23:19    Procedures Procedures (including critical care time)  Medications Ordered in ED Medications  propofol (DIPRIVAN) 10 mg/mL bolus/IV push 80 mg (not administered)  propofol (DIPRIVAN) 10 mg/mL bolus/IV push (20 mg Intravenous Given 01/06/16 0003)     Initial Impression / Assessment and Plan / ED Course  I have reviewed the triage vital signs and the nursing notes.  Pertinent labs & imaging results that were available during my care of the patient were reviewed by me and considered in my medical decision making (see chart for details).  Clinical Course    CRITICAL CARE Performed by: Dorthula MatasGREENE,Jovaun Levene G Total critical care time: 35 minutes Critical care time was exclusive of separately billable procedures and treating other patients. Critical care was necessary to treat or prevent imminent or life-threatening deterioration. Critical care was time spent personally by me on the following activities: development of treatment plan with patient and/or surrogate as well as nursing, discussions with consultants, evaluation of patient's response to treatment, examination of patient, obtaining history from patient or surrogate, ordering and performing treatments and interventions, ordering and review of laboratory studies, ordering and review of radiographic studies, pulse oximetry and re-evaluation of patient's condition.   Patient cardioverted by Dr.  Clydene PughKnott successfully. He recommends starting on Metoprolol 12.5 BID. Pt will see her cardiologist this week for follow-up. Discussed return precautions.  I discussed results, diagnoses and plan with Essie Harthelma P Visscher. They voice there understanding and questions were answered. We discussed follow-up recommendations and return precautions.   Final Clinical Impressions(s) / ED Diagnoses   Final diagnoses:  Atrial fibrillation, unspecified type (HCC)    New Prescriptions New Prescriptions   METOPROLOL SUCCINATE (TOPROL-XL) 25 MG 24 HR TABLET    Take 0.5 tablets (12.5 mg total) by mouth 2 (two) times daily.     Marlon Peliffany Maedell Hedger, PA-C 01/06/16 [id number]    Lyndal Pulleyaniel Knott, MD 01/06/16 [phone]

## 2016-01-05 NOTE — ED Triage Notes (Signed)
Per EMS, pt from home with c/o palpitations x 1 hour. Pt denies shortness of breath or chest pain. Given 20 mg diltiazem PTA. HR initially 130-150. After diltiazem, HR between 100-115.

## 2016-01-06 DIAGNOSIS — I4891 Unspecified atrial fibrillation: Secondary | ICD-10-CM | POA: Diagnosis not present

## 2016-01-06 MED ORDER — PROPOFOL 10 MG/ML IV BOLUS
INTRAVENOUS | Status: AC | PRN
  Administered 2016-01-06 (×2): 20 mg via INTRAVENOUS

## 2016-01-06 MED ORDER — METOPROLOL SUCCINATE ER 25 MG PO TB24: 12.5000 mg | ORAL_TABLET | Freq: Two times a day (BID) | ORAL | 0 refills | Status: DC

## 2016-01-06 NOTE — ED Provider Notes (Signed)
.  Cardioversion Date/Time: 01/06/2016 12:15 AM Performed by: Lyndal PulleyKNOTT, Akyah Lagrange Authorized by: Lyndal PulleyKNOTT, Kenesha Moshier   Consent:    Consent obtained:  Verbal and written   Consent given by:  Patient   Risks discussed:  Induced arrhythmia and pain   Alternatives discussed:  Anti-coagulation medication, rate-control medication and observation Pre-procedure details:    Cardioversion basis:  Elective   Rhythm:  Atrial fibrillation   Electrode placement:  Anterior-posterior Attempt one:    Cardioversion mode:  Synchronous   Waveform:  Biphasic   Shock (Joules):  150   Shock outcome:  Conversion to normal sinus rhythm Post-procedure details:    Patient status:  Awake   Patient tolerance of procedure:  Tolerated well, no immediate complications    Procedural sedation Performed by: Lyndal PulleyKnott, Savannha Welle Consent: Verbal consent obtained. Risks and benefits: risks, benefits and alternatives were discussed Required items: required blood products, implants, devices, and special equipment available Patient identity confirmed: arm band and provided demographic data Time out: Immediately prior to procedure a "time out" was called to verify the correct patient, procedure, equipment, support staff and site/side marked as required.  Sedation type: moderate (conscious) sedation NPO time confirmed and considedered  Sedatives: PROPOFOL  Physician Time at Bedside: 20 minutes  Vitals: Vital signs were monitored during sedation. Cardiac Monitor, pulse oximeter Patient tolerance: Patient tolerated the procedure well with no immediate complications. Comments: Pt with uneventful recovered. Returned to pre-procedural sedation baseline      Lyndal Pulleyaniel Corabelle Spackman, MD 01/06/16 [phone]

## 2016-01-06 NOTE — Sedation Documentation (Signed)
Synchronized cardioversion with 150 joules performed by Dr. Clydene PughKnott at this time.

## 2016-01-13 ENCOUNTER — Encounter: Payer: Self-pay | Admitting: Cardiology

## 2016-02-04 ENCOUNTER — Other Ambulatory Visit: Payer: Self-pay | Admitting: General Surgery

## 2016-02-04 DIAGNOSIS — E041 Nontoxic single thyroid nodule: Secondary | ICD-10-CM

## 2016-02-17 ENCOUNTER — Other Ambulatory Visit: Payer: Self-pay | Admitting: General Surgery

## 2016-02-17 ENCOUNTER — Ambulatory Visit
Admission: RE | Admit: 2016-02-17 | Discharge: 2016-02-17 | Disposition: A | Discharge status: Home / Self Care | Payer: Self-pay | Source: Ambulatory Visit | Attending: General Surgery | Admitting: General Surgery

## 2016-02-17 DIAGNOSIS — E041 Nontoxic single thyroid nodule: Secondary | ICD-10-CM

## 2016-02-18 ENCOUNTER — Other Ambulatory Visit: Payer: Self-pay | Admitting: General Surgery

## 2016-02-18 DIAGNOSIS — E041 Nontoxic single thyroid nodule: Secondary | ICD-10-CM

## 2016-02-25 ENCOUNTER — Ambulatory Visit
Admission: RE | Admit: 2016-02-25 | Discharge: 2016-02-25 | Disposition: A | Discharge status: Home / Self Care | Payer: BLUE CROSS/BLUE SHIELD | Source: Ambulatory Visit | Attending: General Surgery | Admitting: General Surgery

## 2016-02-25 ENCOUNTER — Other Ambulatory Visit (HOSPITAL_COMMUNITY)
Admission: RE | Admit: 2016-02-25 | Discharge: 2016-02-25 | Disposition: A | Discharge status: Home / Self Care | Payer: BLUE CROSS/BLUE SHIELD | Source: Ambulatory Visit | Attending: General Surgery | Admitting: General Surgery

## 2016-02-25 DIAGNOSIS — E041 Nontoxic single thyroid nodule: Secondary | ICD-10-CM | POA: Insufficient documentation

## 2016-04-20 ENCOUNTER — Other Ambulatory Visit: Payer: Self-pay | Admitting: Obstetrics and Gynecology

## 2016-04-20 DIAGNOSIS — Z1231 Encounter for screening mammogram for malignant neoplasm of breast: Secondary | ICD-10-CM

## 2016-06-03 ENCOUNTER — Ambulatory Visit
Admission: RE | Admit: 2016-06-03 | Discharge: 2016-06-03 | Disposition: A | Discharge status: Home / Self Care | Payer: BLUE CROSS/BLUE SHIELD | Source: Ambulatory Visit | Attending: Obstetrics and Gynecology | Admitting: Obstetrics and Gynecology

## 2016-06-03 DIAGNOSIS — Z1231 Encounter for screening mammogram for malignant neoplasm of breast: Secondary | ICD-10-CM

## 2016-06-30 ENCOUNTER — Other Ambulatory Visit (HOSPITAL_COMMUNITY): Payer: Self-pay | Admitting: Gastroenterology

## 2016-06-30 DIAGNOSIS — K3184 Gastroparesis: Secondary | ICD-10-CM

## 2016-07-09 ENCOUNTER — Encounter (HOSPITAL_COMMUNITY)
Admission: RE | Admit: 2016-07-09 | Discharge: 2016-07-09 | Disposition: A | Discharge status: Home / Self Care | Payer: BLUE CROSS/BLUE SHIELD | Source: Ambulatory Visit | Attending: Gastroenterology | Admitting: Gastroenterology

## 2016-07-09 DIAGNOSIS — K3184 Gastroparesis: Secondary | ICD-10-CM

## 2016-07-09 MED ORDER — TECHNETIUM TC 99M SULFUR COLLOID FILTERED
2.0000 | Freq: Once | INTRAVENOUS | Status: AC | PRN
  Administered 2016-07-09: 2 via INTRADERMAL

## 2016-08-28 ENCOUNTER — Observation Stay (HOSPITAL_COMMUNITY)
Admission: EM | Admit: 2016-08-28 | Discharge: 2016-08-30 | Disposition: A | Discharge status: Home / Self Care | Payer: BLUE CROSS/BLUE SHIELD | Attending: Internal Medicine | Admitting: Internal Medicine

## 2016-08-28 ENCOUNTER — Encounter (HOSPITAL_COMMUNITY): Payer: Self-pay | Admitting: *Deleted

## 2016-08-28 ENCOUNTER — Emergency Department (HOSPITAL_COMMUNITY): Payer: BLUE CROSS/BLUE SHIELD

## 2016-08-28 DIAGNOSIS — R740 Nonspecific elevation of levels of transaminase and lactic acid dehydrogenase [LDH]: Secondary | ICD-10-CM

## 2016-08-28 DIAGNOSIS — B179 Acute viral hepatitis, unspecified: Principal | ICD-10-CM | POA: Diagnosis present

## 2016-08-28 DIAGNOSIS — N390 Urinary tract infection, site not specified: Secondary | ICD-10-CM | POA: Diagnosis present

## 2016-08-28 DIAGNOSIS — E669 Obesity, unspecified: Secondary | ICD-10-CM | POA: Insufficient documentation

## 2016-08-28 DIAGNOSIS — E039 Hypothyroidism, unspecified: Secondary | ICD-10-CM | POA: Diagnosis not present

## 2016-08-28 DIAGNOSIS — Z6836 Body mass index (BMI) 36.0-36.9, adult: Secondary | ICD-10-CM | POA: Insufficient documentation

## 2016-08-28 DIAGNOSIS — R1011 Right upper quadrant pain: Secondary | ICD-10-CM

## 2016-08-28 DIAGNOSIS — R17 Unspecified jaundice: Secondary | ICD-10-CM | POA: Diagnosis present

## 2016-08-28 DIAGNOSIS — K3184 Gastroparesis: Secondary | ICD-10-CM | POA: Insufficient documentation

## 2016-08-28 DIAGNOSIS — K219 Gastro-esophageal reflux disease without esophagitis: Secondary | ICD-10-CM | POA: Diagnosis not present

## 2016-08-28 DIAGNOSIS — I10 Essential (primary) hypertension: Secondary | ICD-10-CM | POA: Diagnosis present

## 2016-08-28 DIAGNOSIS — Z79899 Other long term (current) drug therapy: Secondary | ICD-10-CM | POA: Diagnosis not present

## 2016-08-28 DIAGNOSIS — E876 Hypokalemia: Secondary | ICD-10-CM | POA: Diagnosis present

## 2016-08-28 DIAGNOSIS — R7401 Elevation of levels of liver transaminase levels: Secondary | ICD-10-CM

## 2016-08-28 HISTORY — DX: Gastroparesis: K31.84

## 2016-08-28 LAB — CBC WITH DIFFERENTIAL/PLATELET
BASOS ABS: 0 10*3/uL (ref 0.0–0.1)
Basophils Relative: 0 %
EOS ABS: 0 10*3/uL (ref 0.0–0.7)
Eosinophils Relative: 0 %
HCT: 39.3 % (ref 36.0–46.0)
Hemoglobin: 13.7 g/dL (ref 12.0–15.0)
LYMPHS PCT: 49 %
Lymphs Abs: 3.8 10*3/uL (ref 0.7–4.0)
MCH: 28.1 pg (ref 26.0–34.0)
MCHC: 34.9 g/dL (ref 30.0–36.0)
MCV: 80.5 fL (ref 78.0–100.0)
MONO ABS: 1 10*3/uL (ref 0.1–1.0)
Monocytes Relative: 13 %
NEUTROS PCT: 38 %
Neutro Abs: 2.9 10*3/uL (ref 1.7–7.7)
PLATELETS: 192 10*3/uL (ref 150–400)
RBC: 4.88 MIL/uL (ref 3.87–5.11)
RDW: 16.5 % — AB (ref 11.5–15.5)
WBC: 7.7 10*3/uL (ref 4.0–10.5)

## 2016-08-28 LAB — COMPREHENSIVE METABOLIC PANEL
ALT: 1287 U/L — AB (ref 14–54)
AST: 1233 U/L — AB (ref 15–41)
Albumin: 3.1 g/dL — ABNORMAL LOW (ref 3.5–5.0)
Alkaline Phosphatase: 162 U/L — ABNORMAL HIGH (ref 38–126)
Anion gap: 6 (ref 5–15)
BUN: 5 mg/dL — ABNORMAL LOW (ref 6–20)
CHLORIDE: 106 mmol/L (ref 101–111)
CO2: 25 mmol/L (ref 22–32)
CREATININE: 0.86 mg/dL (ref 0.44–1.00)
Calcium: 8.9 mg/dL (ref 8.9–10.3)
GFR calc Af Amer: >60 mL/min (ref >60)
Glucose, Bld: 118 mg/dL — ABNORMAL HIGH (ref 65–99)
POTASSIUM: 3 mmol/L — AB (ref 3.5–5.1)
SODIUM: 137 mmol/L (ref 135–145)
Total Bilirubin: 7.3 mg/dL — ABNORMAL HIGH (ref 0.3–1.2)
Total Protein: 6.4 g/dL — ABNORMAL LOW (ref 6.5–8.1)

## 2016-08-28 LAB — LIPASE, BLOOD: LIPASE: 30 U/L (ref 11–51)

## 2016-08-28 LAB — PROTIME-INR
INR: 1.26
PROTHROMBIN TIME: 15.9 s — AB (ref 11.4–15.2)

## 2016-08-28 LAB — ACETAMINOPHEN LEVEL: Acetaminophen (Tylenol), Serum: <10 ug/mL — ABNORMAL LOW (ref 10–30)

## 2016-08-28 MED ORDER — SODIUM CHLORIDE 0.9 % IV BOLUS (SEPSIS)
1000.0000 mL | Freq: Once | INTRAVENOUS | Status: AC
  Administered 2016-08-28: 1000 mL via INTRAVENOUS

## 2016-08-28 NOTE — ED Provider Notes (Signed)
[facility]-EMERGENCY DEPT Provider Note   CSN: [id number] Arrival date & time: 08/28/16  1736     History   Chief Complaint Chief Complaint  Patient presents with  . Abnormal Lab    HPI Rachel [name] is a 51 y.o. female.  HPI   51 yo F with PMHx HTN, gastroparesis here with moderate abodminal pain. Pt states that for several weeks, she has had progressive worsening epigastric pain, mild nausea, and discomfort. She attributes this to her gastroparesis, however, so she has not paid attention to it. It is not necessarily worse than her usual pain that has been present for years. She went to her PCP today for routine eval, at which time she was told she had severe elevated liver enzymes. She underwent an U/S which did not show her GB. She was advised to present here. Denies any complaints currently. She does feel mildly fatigued. Does not drink alcohol. No recent travel outside of [place]. No APAP use regularly. No supplements/dietary changes.  Past Medical History:  Diagnosis Date  . Abnormal pap   . Abnormal Pap smear   . Gastroparesis   . Hypertension   . Thyroid disease    "nodule"    Patient Active Problem List   Diagnosis Date Noted  . Acute hepatitis 08/29/2016  . Jaundice 08/29/2016  . Hypokalemia 08/29/2016  . Hypothyroidism 08/29/2016  . Hypertension 08/29/2016  . Acute lower UTI 08/29/2016  . Abnormal pap   . GASTROPARESIS 07/26/2008  . OBESITY 05/12/2007  . GERD 11/03/2005    Past Surgical History:  Procedure Laterality Date  . COLONOSCOPY    . HERNIA REPAIR    . JOINT REPLACEMENT    . KNEE SURGERY    . MYOMECTOMY    . THROAT SURGERY      OB History    Gravida Para Term Preterm AB Living   2 2       0   SAB TAB Ectopic Multiple Live Births                   Home Medications    Prior to Admission medications   Medication Sig Start Date End Date Taking? Authorizing Provider  albuterol (PROVENTIL HFA;VENTOLIN HFA) 108 (90 Base) MCG/ACT inhaler Inhale 1-2  puffs into the lungs every 6 (six) hours as needed for wheezing or shortness of breath.   Yes [provider]  CALCIUM-MAGNESIUM-ZINC PO Take 1 tablet by mouth daily.   Yes [provider]  cholecalciferol (VITAMIN D) 1000 UNITS tablet Take 1,000 Units by mouth daily.   Yes [provider]  ciprofloxacin (CIPRO) 500 MG tablet Take 500 mg by mouth 2 (two) times daily.   Yes [provider]  cycloSPORINE (RESTASIS) 0.05 % ophthalmic emulsion Place 1 drop into both eyes 2 (two) times daily as needed (dryness).    Yes [provider]  dexlansoprazole (DEXILANT) 60 MG capsule Take 60 mg by mouth daily.   Yes [provider]  erythromycin base (E-MYCIN) 500 MG tablet Take 500 mg by mouth 2 (two) times daily.   Yes [provider]  fish oil-omega-3 fatty acids 1000 MG capsule Take 2 g by mouth daily.   Yes [provider]  hydroxypropyl methylcellulose / hypromellose (ISOPTO TEARS / GONIOVISC) 2.5 % ophthalmic solution Place 1 drop into both eyes 3 (three) times daily as needed for dry eyes.   Yes [provider]  levothyroxine (SYNTHROID, LEVOTHROID) 75 MCG tablet Take 75 mcg by mouth  every other day.    Yes [provider]  levothyroxine (SYNTHROID, LEVOTHROID) 88 MCG tablet Take 88 mcg by mouth every other day.    Yes [provider]  montelukast (SINGULAIR) 10 MG tablet Take 10 mg by mouth at bedtime.   Yes [provider]  nebivolol (BYSTOLIC) 5 MG tablet Take 5 mg by mouth daily.   Yes [provider]  olmesartan-hydrochlorothiazide (BENICAR HCT) 40-12.5 MG tablet Take 1 tablet by mouth daily.   Yes [provider]  Propylene Glycol (SYSTANE BALANCE) 0.6 % SOLN Place 1-2 drops into both eyes daily.   Yes [provider]  saccharomyces boulardii (FLORASTOR) 250 MG capsule Take 250 mg by mouth daily.    Yes [provider]    Family History Family History    Problem Relation Age of Onset  . Heart disease Maternal Grandmother   . Diabetes Maternal Grandmother   . Colon cancer Father   . Heart attack Mother   . Diabetes Mother   . Hypertension Brother   . Multiple sclerosis Sister   . Diabetes Sister   . Breast cancer Cousin     Social History Social History  Substance Use Topics  . Smoking status: Never Smoker  . Smokeless tobacco: Never Used  . Alcohol use Yes     Allergies   Oxycodone-acetaminophen   Review of Systems Review of Systems  Constitutional: Positive for fatigue. Negative for chills and fever.  HENT: Negative for congestion and rhinorrhea.   Eyes: Negative for visual disturbance.  Respiratory: Negative for cough, shortness of breath and wheezing.   Cardiovascular: Negative for chest pain and leg swelling.  Gastrointestinal: Positive for abdominal pain and nausea. Negative for diarrhea and vomiting.  Genitourinary: Negative for dysuria and flank pain.  Musculoskeletal: Negative for neck pain and neck stiffness.  Skin: Negative for rash and wound.  Allergic/Immunologic: Negative for immunocompromised state.  Neurological: Negative for syncope, weakness and headaches.  All other systems reviewed and are negative.    Physical Exam Updated Vital Signs BP (!) 134/94 (BP Location: Left Arm)   Pulse 80   Temp 98.6 F (37 C) (Oral)   Resp 18   Ht 5\' 7"  (1.702 m)   Wt 104.8 kg (231 lb)   SpO2 100%   BMI 36.18 kg/m   Physical Exam  Constitutional: She is oriented to person, place, and time. She appears well-developed and well-nourished. No distress.  HENT:  Head: Normocephalic and atraumatic.  Eyes: Conjunctivae are normal.  Neck: Neck supple.  Cardiovascular: Normal rate, regular rhythm and normal heart sounds.  Exam reveals no friction rub.   No murmur heard. Pulmonary/Chest: Effort normal and breath sounds normal. No respiratory distress. She has no wheezes. She has no rales.  Abdominal: Soft. Bowel  sounds are normal. She exhibits no distension. There is tenderness (mild, epigastric and RUQ). There is no rebound and no guarding.  Musculoskeletal: She exhibits no edema.  Neurological: She is alert and oriented to person, place, and time. She exhibits normal muscle tone.  Skin: Skin is warm. Capillary refill takes less than 2 seconds.  Psychiatric: She has a normal mood and affect.  Nursing note and vitals reviewed.    ED Treatments / Results  Labs (all labs ordered are listed, but only abnormal results are displayed) Labs Reviewed  CBC WITH DIFFERENTIAL/PLATELET - Abnormal; Notable for the following:       Result Value   RDW 16.5 (*)    All other components  within normal limits  COMPREHENSIVE METABOLIC PANEL - Abnormal; Notable for the following:    Potassium 3.0 (*)    Glucose, Bld 118 (*)    BUN 5 (*)    Total Protein 6.4 (*)    Albumin 3.1 (*)    AST 1,233 (*)    ALT 1,287 (*)    Alkaline Phosphatase 162 (*)    Total Bilirubin 7.3 (*)    All other components within normal limits  PROTIME-INR - Abnormal; Notable for the following:    Prothrombin Time 15.9 (*)    All other components within normal limits  ACETAMINOPHEN LEVEL - Abnormal; Notable for the following:    Acetaminophen (Tylenol), Serum <10 (*)    All other components within normal limits  URINE CULTURE  LIPASE, BLOOD  HEPATITIS PANEL, ACUTE  MAGNESIUM  COMPREHENSIVE METABOLIC PANEL  CBC  PROTIME-INR  HIV ANTIBODY (ROUTINE TESTING)  URINALYSIS, ROUTINE W REFLEX MICROSCOPIC    EKG  EKG Interpretation None       Radiology US Abdomen Limited Ruq  Result Date: 08/28/2016 CLINICAL DATA:  Acute onset of right upper quadrant abdominal pain. Initial encounter. EXAM: ULTRASOUND ABDOMEN LIMITED RIGHT UPPER QUADRANT COMPARISON:  None. FINDINGS: Gallbladder: No gallstones or wall thickening visualized. No sonographic Murphy sign noted by sonographer. Common bile duct: Diameter: 0.3 cm, within normal limits in  caliber. Liver: No focal lesion identified. Within normal limits in parenchymal echogenicity. IMPRESSION: Unremarkable ultrasound of the right upper quadrant. Electronically Signed   By: Roanna Raider M.D.   On: 08/28/2016 23:07    Procedures Procedures (including critical care time)  Medications Ordered in ED Medications  potassium phosphate 10 mEq in dextrose 5 % 250 mL infusion (not administered)  magnesium sulfate IVPB 1 g 100 mL (not administered)  famotidine (PEPCID) IVPB 20 mg premix (0 mg Intravenous Stopped 08/29/16 0158)  hydroxypropyl methylcellulose / hypromellose (ISOPTO TEARS / GONIOVISC) 2.5 % ophthalmic solution 1 drop (not administered)  polyvinyl alcohol (LIQUIFILM TEARS) 1.4 % ophthalmic solution 1-2 drop (not administered)  albuterol (PROVENTIL) (2.5 MG/3ML) 0.083% nebulizer solution 2.5 mg (not administered)  levothyroxine (SYNTHROID, LEVOTHROID) tablet 75 mcg (not administered)  levothyroxine (SYNTHROID, LEVOTHROID) tablet 88 mcg (not administered)  saccharomyces boulardii (FLORASTOR) capsule 250 mg (not administered)  cholecalciferol (VITAMIN D) tablet 1,000 Units (not administered)  erythromycin (E-MYCIN) tablet 500 mg (not administered)  montelukast (SINGULAIR) tablet 10 mg (not administered)  nebivolol (BYSTOLIC) tablet 5 mg (not administered)  ciprofloxacin (CIPRO) tablet 500 mg (not administered)  enoxaparin (LOVENOX) injection 40 mg (not administered)  0.9 %  sodium chloride infusion (not administered)  ondansetron (ZOFRAN) tablet 4 mg (not administered)    Or  ondansetron (ZOFRAN) injection 4 mg (not administered)  sodium chloride 0.9 % bolus 1,000 mL (0 mLs Intravenous Stopped 08/28/16 2245)  0.9 %  sodium chloride infusion ( Intravenous New Bag/Given 08/29/16 0040)  potassium chloride SA (K-DUR,KLOR-CON) CR tablet 20 mEq (20 mEq Oral Given 08/29/16 0127)     Initial Impression / Assessment and Plan / ED Course  I have reviewed the triage vital signs and  the nursing notes.  Pertinent labs & imaging results that were available during my care of the patient were reviewed by me and considered in my medical decision making (see chart for details).     51 yo AAF here with mild abdominal discomfort and nausea, found to have acute hepatitis on outside labs (see images in chart). Here, pt has markedly elevated AST, ALT as well as  AlkP 162, Bili 7.3 c/w cholestatic picture although RUQ U/S is negative.  Pt declines alcohol use. No APAP use. No recent travel outside of [place]. No suspicious food exposures. No recent viral syndromes or sick contacts.  DDx remains possible acute viral hepatitis, also autoimmune hepatitis. Discussed with GI. They recommend hepatitis panel, IVF, and repeat labs in AM. Patient added to their consult list.  Final Clinical Impressions(s) / ED Diagnoses   Final diagnoses:  RUQ pain  Acute hepatitis    New Prescriptions Current Discharge Medication List       Shaune Pollack, MD 08/29/16 [phone]

## 2016-08-28 NOTE — ED Notes (Signed)
Patient transported to Ultrasound 

## 2016-08-28 NOTE — ED Notes (Signed)
ED Provider at bedside. 

## 2016-08-28 NOTE — ED Notes (Signed)
Explained to family we are waiting on gastroenterology consult. Pt and family verbalized understanding.

## 2016-08-28 NOTE — ED Notes (Addendum)
Pt still in US

## 2016-08-28 NOTE — ED Triage Notes (Addendum)
To ED for further eval of increasing liver enzymes. First blood draw was Thursday - AST 1356/ ALT 1520. Labs redrawn this am and reported higher. Pt was sent for ultrasound of gallbladder and told she has 'bile and bile gas'. Minimal abd pain. No vomiting. Currently being treated for UTI. Stools have become a lighter brown color than her normal. Told to come to ED for further testing.

## 2016-08-28 NOTE — ED Notes (Signed)
US at the bedside per Dr. Erma HeritageIsaacs.

## 2016-08-28 NOTE — ED Notes (Signed)
Pt asking if we have to repeat blood work due to having blood work done twice for confirmation of values at her PCP. Pt brought paperwork with her validating lab work, will confirm with Dr. Erma HeritageIsaacs he wants blood work to be repeated in ED.

## 2016-08-29 ENCOUNTER — Encounter (HOSPITAL_COMMUNITY): Payer: Self-pay | Admitting: Family Medicine

## 2016-08-29 DIAGNOSIS — R7989 Other specified abnormal findings of blood chemistry: Secondary | ICD-10-CM

## 2016-08-29 DIAGNOSIS — I10 Essential (primary) hypertension: Secondary | ICD-10-CM | POA: Diagnosis not present

## 2016-08-29 DIAGNOSIS — N390 Urinary tract infection, site not specified: Secondary | ICD-10-CM | POA: Diagnosis present

## 2016-08-29 DIAGNOSIS — B179 Acute viral hepatitis, unspecified: Secondary | ICD-10-CM

## 2016-08-29 DIAGNOSIS — R740 Nonspecific elevation of levels of transaminase and lactic acid dehydrogenase [LDH]: Secondary | ICD-10-CM

## 2016-08-29 DIAGNOSIS — R17 Unspecified jaundice: Secondary | ICD-10-CM | POA: Diagnosis present

## 2016-08-29 DIAGNOSIS — E876 Hypokalemia: Secondary | ICD-10-CM | POA: Diagnosis present

## 2016-08-29 DIAGNOSIS — E039 Hypothyroidism, unspecified: Secondary | ICD-10-CM | POA: Diagnosis present

## 2016-08-29 LAB — GLUCOSE, CAPILLARY: Glucose-Capillary: 119 mg/dL — ABNORMAL HIGH (ref 65–99)

## 2016-08-29 LAB — URINALYSIS, ROUTINE W REFLEX MICROSCOPIC
GLUCOSE, UA: NEGATIVE mg/dL
HGB URINE DIPSTICK: NEGATIVE
KETONES UR: NEGATIVE mg/dL
LEUKOCYTES UA: NEGATIVE
Nitrite: NEGATIVE
PROTEIN: NEGATIVE mg/dL
Specific Gravity, Urine: 1.01 (ref 1.005–1.030)
pH: 6 (ref 5.0–8.0)

## 2016-08-29 LAB — COMPREHENSIVE METABOLIC PANEL
ALT: 1196 U/L — ABNORMAL HIGH (ref 14–54)
ANION GAP: 6 (ref 5–15)
AST: 1108 U/L — ABNORMAL HIGH (ref 15–41)
Albumin: 2.7 g/dL — ABNORMAL LOW (ref 3.5–5.0)
Alkaline Phosphatase: 151 U/L — ABNORMAL HIGH (ref 38–126)
BUN: 5 mg/dL — ABNORMAL LOW (ref 6–20)
CHLORIDE: 108 mmol/L (ref 101–111)
CO2: 27 mmol/L (ref 22–32)
Calcium: 8.1 mg/dL — ABNORMAL LOW (ref 8.9–10.3)
Creatinine, Ser: 0.74 mg/dL (ref 0.44–1.00)
Glucose, Bld: 112 mg/dL — ABNORMAL HIGH (ref 65–99)
POTASSIUM: 3 mmol/L — AB (ref 3.5–5.1)
SODIUM: 141 mmol/L (ref 135–145)
Total Bilirubin: 6.2 mg/dL — ABNORMAL HIGH (ref 0.3–1.2)
Total Protein: 5.6 g/dL — ABNORMAL LOW (ref 6.5–8.1)

## 2016-08-29 LAB — CBC
HCT: 36.3 % (ref 36.0–46.0)
HEMOGLOBIN: 12.2 g/dL (ref 12.0–15.0)
MCH: 27.3 pg (ref 26.0–34.0)
MCHC: 33.6 g/dL (ref 30.0–36.0)
MCV: 81.2 fL (ref 78.0–100.0)
PLATELETS: 190 10*3/uL (ref 150–400)
RBC: 4.47 MIL/uL (ref 3.87–5.11)
RDW: 16.9 % — ABNORMAL HIGH (ref 11.5–15.5)
WBC: 6.1 10*3/uL (ref 4.0–10.5)

## 2016-08-29 LAB — PROTIME-INR
INR: 1.29
Prothrombin Time: 16.2 seconds — ABNORMAL HIGH (ref 11.4–15.2)

## 2016-08-29 LAB — HIV ANTIBODY (ROUTINE TESTING W REFLEX): HIV SCREEN 4TH GENERATION: NONREACTIVE

## 2016-08-29 LAB — MAGNESIUM: MAGNESIUM: 1.8 mg/dL (ref 1.7–2.4)

## 2016-08-29 MED ORDER — VITAMIN D 1000 UNITS PO TABS
1000.0000 [IU] | ORAL_TABLET | Freq: Every day | ORAL | Status: DC
  Administered 2016-08-29 – 2016-08-30 (×2): 1000 [IU] via ORAL
  Filled 2016-08-29 (×2): qty 1

## 2016-08-29 MED ORDER — MAGNESIUM SULFATE IN D5W 1-5 GM/100ML-% IV SOLN
1.0000 g | Freq: Once | INTRAVENOUS | Status: AC
  Administered 2016-08-29: 1 g via INTRAVENOUS
  Filled 2016-08-29: qty 100

## 2016-08-29 MED ORDER — ONDANSETRON HCL 4 MG PO TABS: 4.0000 mg | ORAL_TABLET | Freq: Four times a day (QID) | ORAL | Status: DC | PRN

## 2016-08-29 MED ORDER — OLMESARTAN MEDOXOMIL-HCTZ 40-12.5 MG PO TABS: 1.0000 | ORAL_TABLET | Freq: Every day | ORAL | Status: DC

## 2016-08-29 MED ORDER — MONTELUKAST SODIUM 10 MG PO TABS
10.0000 mg | ORAL_TABLET | Freq: Every day | ORAL | Status: DC
  Administered 2016-08-29 (×2): 10 mg via ORAL
  Filled 2016-08-29 (×2): qty 1

## 2016-08-29 MED ORDER — LEVOTHYROXINE SODIUM 75 MCG PO TABS
75.0000 ug | ORAL_TABLET | ORAL | Status: DC
Start: 2016-08-29 — End: 2016-08-30
  Administered 2016-08-29: 75 ug via ORAL
  Filled 2016-08-29 (×2): qty 1

## 2016-08-29 MED ORDER — POTASSIUM CHLORIDE CRYS ER 20 MEQ PO TBCR
20.0000 meq | EXTENDED_RELEASE_TABLET | Freq: Once | ORAL | Status: AC
  Administered 2016-08-29: 20 meq via ORAL
  Filled 2016-08-29: qty 1

## 2016-08-29 MED ORDER — IRBESARTAN 300 MG PO TABS
300.0000 mg | ORAL_TABLET | Freq: Every day | ORAL | Status: DC
  Administered 2016-08-29 – 2016-08-30 (×2): 300 mg via ORAL
  Filled 2016-08-29 (×2): qty 1

## 2016-08-29 MED ORDER — ONDANSETRON HCL 4 MG/2ML IJ SOLN: 4.0000 mg | Freq: Four times a day (QID) | INTRAMUSCULAR | Status: DC | PRN

## 2016-08-29 MED ORDER — ERYTHROMYCIN BASE 250 MG PO TABS
500.0000 mg | ORAL_TABLET | Freq: Two times a day (BID) | ORAL | Status: DC
  Filled 2016-08-29 (×2): qty 2

## 2016-08-29 MED ORDER — SODIUM CHLORIDE 0.9 % IV SOLN
INTRAVENOUS | Status: AC
  Administered 2016-08-29: 11:00:00 via INTRAVENOUS

## 2016-08-29 MED ORDER — NEBIVOLOL HCL 5 MG PO TABS
5.0000 mg | ORAL_TABLET | Freq: Every day | ORAL | Status: DC
  Administered 2016-08-29: 5 mg via ORAL
  Filled 2016-08-29 (×2): qty 1

## 2016-08-29 MED ORDER — CIPROFLOXACIN HCL 500 MG PO TABS
500.0000 mg | ORAL_TABLET | Freq: Two times a day (BID) | ORAL | Status: DC
  Administered 2016-08-29 – 2016-08-30 (×3): 500 mg via ORAL
  Filled 2016-08-29 (×3): qty 1

## 2016-08-29 MED ORDER — HYDROCHLOROTHIAZIDE 12.5 MG PO CAPS
12.5000 mg | ORAL_CAPSULE | Freq: Every day | ORAL | Status: DC
  Administered 2016-08-29 – 2016-08-30 (×2): 12.5 mg via ORAL
  Filled 2016-08-29 (×2): qty 1

## 2016-08-29 MED ORDER — HYPROMELLOSE (GONIOSCOPIC) 2.5 % OP SOLN: 1.0000 [drp] | Freq: Three times a day (TID) | OPHTHALMIC | Status: DC | PRN

## 2016-08-29 MED ORDER — POTASSIUM CHLORIDE CRYS ER 20 MEQ PO TBCR
40.0000 meq | EXTENDED_RELEASE_TABLET | Freq: Once | ORAL | Status: AC
  Administered 2016-08-29: 40 meq via ORAL
  Filled 2016-08-29: qty 2

## 2016-08-29 MED ORDER — LEVOTHYROXINE SODIUM 88 MCG PO TABS
88.0000 ug | ORAL_TABLET | ORAL | Status: DC
  Administered 2016-08-30: 88 ug via ORAL
  Filled 2016-08-29: qty 1

## 2016-08-29 MED ORDER — SACCHAROMYCES BOULARDII 250 MG PO CAPS
250.0000 mg | ORAL_CAPSULE | Freq: Every day | ORAL | Status: DC
  Administered 2016-08-29 – 2016-08-30 (×2): 250 mg via ORAL
  Filled 2016-08-29 (×2): qty 1

## 2016-08-29 MED ORDER — SODIUM CHLORIDE 0.9 % IV SOLN
Freq: Once | INTRAVENOUS | Status: AC
  Administered 2016-08-29: 01:00:00 via INTRAVENOUS

## 2016-08-29 MED ORDER — ENOXAPARIN SODIUM 40 MG/0.4ML ~~LOC~~ SOLN
40.0000 mg | Freq: Every day | SUBCUTANEOUS | Status: DC
  Administered 2016-08-29 – 2016-08-30 (×2): 40 mg via SUBCUTANEOUS
  Filled 2016-08-29 (×2): qty 0.4

## 2016-08-29 MED ORDER — ALBUTEROL SULFATE (2.5 MG/3ML) 0.083% IN NEBU: 2.5000 mg | INHALATION_SOLUTION | Freq: Four times a day (QID) | RESPIRATORY_TRACT | Status: DC | PRN

## 2016-08-29 MED ORDER — DEXTROSE 5 % IV SOLN
10.0000 meq | Freq: Once | INTRAVENOUS | Status: AC
  Administered 2016-08-29: 10 meq via INTRAVENOUS
  Filled 2016-08-29: qty 2.27

## 2016-08-29 MED ORDER — POLYVINYL ALCOHOL 1.4 % OP SOLN
1.0000 [drp] | Freq: Every day | OPHTHALMIC | Status: DC
  Administered 2016-08-29 – 2016-08-30 (×2): 1 [drp] via OPHTHALMIC
  Filled 2016-08-29: qty 15

## 2016-08-29 MED ORDER — LEVOTHYROXINE SODIUM 88 MCG PO TABS
88.0000 ug | ORAL_TABLET | ORAL | Status: DC
  Filled 2016-08-29: qty 1

## 2016-08-29 MED ORDER — FAMOTIDINE IN NACL 20-0.9 MG/50ML-% IV SOLN
20.0000 mg | Freq: Two times a day (BID) | INTRAVENOUS | Status: DC
  Administered 2016-08-29 – 2016-08-30 (×4): 20 mg via INTRAVENOUS
  Filled 2016-08-29 (×4): qty 50

## 2016-08-29 NOTE — Progress Notes (Signed)
PROGRESS NOTE                                                                                                                                                                                                             Patient Demographics:    Rachel [name], is a 51 y.o. female, DOB - 04/09/1965, ZOX:[id number]  Admit date - 08/28/2016   Admitting Physician Briscoe Deutscherimothy S Opyd, MD  Outpatient Primary MD for the patient is Coralee RudDuran, Michael R, PA-C  LOS - 0  Outpatient Specialists: GI DR Madilyn FiremanHayes  Chief Complaint  Patient presents with  . Abnormal Lab       Brief Narrative   y.o. female with medical history significant for gastroparesis, hypertension, and hypothyroidism, presenting to the emergency department for evaluation ofElevated LFTs identified by her PCP.   Subjective:    Rachel [name] today has, No headache, No chest pain, No abdominal pain - No Nausea, Or vomiting   Assessment  & Plan :    Principal Problem:   Acute hepatitis Active Problems:   Jaundice   Hypokalemia   Hypothyroidism   Hypertension   Acute lower UTI   Elevated LFTs  - Mixed pattern, no evidence of biliary obstruction on ultrasound, hepatitis panel still pending, GI input greatly appreciated, autoimmune serologies were sent, monitor daily LFTs and INR,  - GI consulting and much appreciated; will follow-up on recommendations - Plan to hydrate, monitor and correct lytes, start H2-blocker, trend LFT's - No recent medications could be attributed to her liver failure, only [place] was started recently, she is on erythromycin which may cause liver injury, but this is for a few years now.  Hypokalemia  - Repleted, recheck in a.m.  UTI - Pt currently under treatment with Cipro for UTI - No fever or leukocytosis  - Send urine for culture, continue Cipro to complete course    Gastroparesis  - Stable  - Continue erythromycin    Hypertension  - Acceptable, continue with home  medication  Hypothyroidism  - Continue with Synthroid       Code Status : Full  Family Communication  : Husband at bedside  Disposition Plan  : home  Consults  :  GI  Procedures  : None  DVT Prophylaxis  :  Lovenox   Lab Results  Component Value Date   PLT  190 08/29/2016    Antibiotics  :    Anti-infectives    Start     Dose/Rate Route Frequency Ordered Stop   08/29/16 0800  erythromycin (E-MYCIN) tablet 500 mg     500 mg Oral 2 times daily with meals 08/29/16 0153     08/29/16 0800  ciprofloxacin (CIPRO) tablet 500 mg     500 mg Oral 2 times daily 08/29/16 0153 09/06/16 0759        Objective:   Vitals:   08/29/16 0130 08/29/16 0201 08/29/16 0546 08/29/16 1421  BP: 111/72 (!) 134/94 123/72 124/80  Pulse: 78 80 77 75  Resp:  18 19 19   Temp:  98.6 F (37 C) 98.6 F (37 C) 98.5 F (36.9 C)  TempSrc:  Oral Oral Oral  SpO2: 100% 100% 100% 100%  Weight:  104.8 kg (231 lb)    Height:  5\' 7"  (1.702 m)      Wt Readings from Last 3 Encounters:  08/29/16 104.8 kg (231 lb)  04/07/12 109.3 kg (241 lb)  01/13/12 114.3 kg (252 lb)     Intake/Output Summary (Last 24 hours) at 08/29/16 1435 Last data filed at 08/29/16 0548  Gross per 24 hour  Intake             1119 ml  Output                0 ml  Net             1119 ml     Physical Exam  Awake Alert, Oriented X 3,  Icteric sclera Supple Neck,No JVD,  Symmetrical Chest wall movement, Good air movement bilaterally, CTAB RRR,No Gallops,Rubs or new Murmurs, No Parasternal Heave +ve B.Sounds, Abd Soft, No tenderness, No rebound - guarding or rigidity. No Cyanosis, Clubbing or edema, No new Rash or bruise      Data Review:    CBC  Recent Labs Lab 08/28/16 2055 08/29/16 0332  WBC 7.7 6.1  HGB 13.7 12.2  HCT 39.3 36.3  PLT 192 190  MCV 80.5 81.2  MCH 28.1 27.3  MCHC 34.9 33.6  RDW 16.5* 16.9*  LYMPHSABS 3.8  --   MONOABS 1.0  --   EOSABS 0.0  --   BASOSABS 0.0  --     Chemistries    Recent Labs Lab 08/28/16 2055 08/29/16 0332  NA 137 141  K 3.0* 3.0*  CL 106 108  CO2 25 27  GLUCOSE 118* 112*  BUN 5* 5*  CREATININE 0.86 0.74  CALCIUM 8.9 8.1*  MG  --  1.8  AST 1,233* 1,108*  ALT 1,287* 1,196*  ALKPHOS 162* 151*  BILITOT 7.3* 6.2*   ------------------------------------------------------------------------------------------------------------------ No results for input(s): CHOL, HDL, LDLCALC, TRIG, CHOLHDL, LDLDIRECT in the last 72 hours.  No results found for: HGBA1C ------------------------------------------------------------------------------------------------------------------ No results for input(s): TSH, T4TOTAL, T3FREE, THYROIDAB in the last 72 hours.  Invalid input(s): FREET3 ------------------------------------------------------------------------------------------------------------------ No results for input(s): VITAMINB12, FOLATE, FERRITIN, TIBC, IRON, RETICCTPCT in the last 72 hours.  Coagulation profile  Recent Labs Lab 08/28/16 2055 08/29/16 0332  INR 1.26 1.29    No results for input(s): DDIMER in the last 72 hours.  Cardiac Enzymes No results for input(s): CKMB, TROPONINI, MYOGLOBIN in the last 168 hours.  Invalid input(s): CK ------------------------------------------------------------------------------------------------------------------ No results found for: BNP  Inpatient Medications  Scheduled Meds: . cholecalciferol  1,000 Units Oral Daily  . ciprofloxacin  500 mg Oral BID  . enoxaparin (LOVENOX) injection  40 mg Subcutaneous Daily  . erythromycin base  500 mg Oral BID WC  . levothyroxine  75 mcg Oral QODAY  . [START ON 08/30/2016] levothyroxine  88 mcg Oral QODAY  . montelukast  10 mg Oral QHS  . nebivolol  5 mg Oral Daily  . polyvinyl alcohol  1-2 drop Both Eyes Daily  . saccharomyces boulardii  250 mg Oral Daily   Continuous Infusions: . famotidine (PEPCID) IV Stopped (08/29/16 1224)   PRN Meds:.albuterol,  hydroxypropyl methylcellulose / hypromellose, ondansetron **OR** ondansetron (ZOFRAN) IV  Micro Results No results found for this or any previous visit (from the past 240 hour(s)).  Radiology Reports US Abdomen Limited Ruq  Result Date: 08/28/2016 CLINICAL DATA:  Acute onset of right upper quadrant abdominal pain. Initial encounter. EXAM: ULTRASOUND ABDOMEN LIMITED RIGHT UPPER QUADRANT COMPARISON:  None. FINDINGS: Gallbladder: No gallstones or wall thickening visualized. No sonographic Murphy sign noted by sonographer. Common bile duct: Diameter: 0.3 cm, within normal limits in caliber. Liver: No focal lesion identified. Within normal limits in parenchymal echogenicity. IMPRESSION: Unremarkable ultrasound of the right upper quadrant. Electronically Signed   By: Roanna Raider M.D.   On: 08/28/2016 23:07     Randol Kern, Bertil Brickey M.D on 08/29/2016 at 2:35 PM  Between 7am to 7pm - Pager - [phone]  After 7pm go to [email or web address] - password [facility]  [facility] -  Office  [phone]

## 2016-08-29 NOTE — Consult Note (Signed)
[facility] Gastroenterology Consultation Note  Referring Provider: Dr. Huey Bienenstock [facility]) Primary Care Physician:  Coralee Rud, PA-C Primary Gastroenterologist:  Dr. Dorena Cookey  Reason for Consultation:  Elevated LFTs  HPI: Rachel [name] is a 51 y.o. female asked to see for elevated LFTs.  Chronic epigastric bloating from gastroparesis.  Jaundiced over the past few days.  No fevers, chills.  LFTs normal few months ago, now significantly elevated.  No recent antibiotics or new medications (OTC or Rx) except for Dexilant.  No prior liver disease.  No pruritus.  No sick exposures or risk factors for viral hepatitis.   Past Medical History:  Diagnosis Date  . Abnormal pap   . Abnormal Pap smear   . Gastroparesis   . Hypertension   . Thyroid disease    "nodule"    Past Surgical History:  Procedure Laterality Date  . COLONOSCOPY    . HERNIA REPAIR    . JOINT REPLACEMENT    . KNEE SURGERY    . MYOMECTOMY    . THROAT SURGERY      Prior to Admission medications   Medication Sig Start Date End Date Taking? Authorizing Provider  albuterol (PROVENTIL HFA;VENTOLIN HFA) 108 (90 Base) MCG/ACT inhaler Inhale 1-2 puffs into the lungs every 6 (six) hours as needed for wheezing or shortness of breath.   Yes [provider]  CALCIUM-MAGNESIUM-ZINC PO Take 1 tablet by mouth daily.   Yes [provider]  cholecalciferol (VITAMIN D) 1000 UNITS tablet Take 1,000 Units by mouth daily.   Yes [provider]  ciprofloxacin (CIPRO) 500 MG tablet Take 500 mg by mouth 2 (two) times daily.   Yes [provider]  cycloSPORINE (RESTASIS) 0.05 % ophthalmic emulsion Place 1 drop into both eyes 2 (two) times daily as needed (dryness).    Yes [provider]  dexlansoprazole (DEXILANT) 60 MG capsule Take 60 mg by mouth daily.   Yes [provider]  erythromycin base (E-MYCIN) 500 MG tablet Take 500 mg by mouth 2 (two) times daily.   Yes [provider]  fish oil-omega-3 fatty acids 1000 MG capsule Take 2 g by mouth daily.   Yes [provider]  hydroxypropyl methylcellulose / hypromellose (ISOPTO TEARS / GONIOVISC) 2.5 % ophthalmic solution Place 1 drop into both eyes 3 (three) times daily as needed for dry eyes.   Yes [provider]  levothyroxine (SYNTHROID, LEVOTHROID) 75 MCG tablet Take 75 mcg by mouth every other day.    Yes [provider]  levothyroxine (SYNTHROID, LEVOTHROID) 88 MCG tablet Take 88 mcg by mouth every other day.    Yes [provider]  montelukast (SINGULAIR) 10 MG tablet Take 10 mg by mouth at bedtime.   Yes [provider]  nebivolol (BYSTOLIC) 5 MG tablet Take 5 mg by mouth daily.   Yes [provider]  olmesartan-hydrochlorothiazide (BENICAR HCT) 40-12.5 MG tablet Take 1 tablet by mouth daily.   Yes [provider]  Propylene Glycol (SYSTANE BALANCE) 0.6 % SOLN Place 1-2 drops into both eyes daily.   Yes [provider]  saccharomyces boulardii (FLORASTOR) 250 MG capsule Take 250 mg by mouth daily.    Yes [provider]    Current Facility-Administered Medications  Medication Dose Route Frequency Provider Last Rate Last Dose  . 0.9 %  sodium chloride infusion   Intravenous Continuous Opyd, Timothy S, MD      . albuterol (PROVENTIL) (2.5 MG/3ML) 0.083% nebulizer solution 2.5  mg  2.5 mg Inhalation Q6H PRN Opyd, Lavone Neriimothy S, MD      . cholecalciferol (VITAMIN D) tablet 1,000 Units  1,000 Units Oral Daily Opyd, Lavone Neriimothy S, MD   1,000 Units at 08/29/16 0949  . ciprofloxacin (CIPRO) tablet 500 mg  500 mg Oral BID Briscoe Deutscherpyd, Timothy S, MD   500 mg at 08/29/16 0807  . enoxaparin (LOVENOX) injection 40 mg  40 mg Subcutaneous Daily Opyd, Lavone Neriimothy S, MD   40 mg at 08/29/16 0949  . erythromycin (E-MYCIN) tablet 500 mg  500 mg Oral BID WC Opyd, Lavone Neriimothy S, MD      . famotidine (PEPCID) IVPB 20 mg premix  20 mg Intravenous Q12H Opyd, Lavone Neriimothy S,  MD   Stopped at 08/29/16 0158  . hydroxypropyl methylcellulose / hypromellose (ISOPTO TEARS / GONIOVISC) 2.5 % ophthalmic solution 1 drop  1 drop Both Eyes TID PRN Opyd, Lavone Neriimothy S, MD      . levothyroxine (SYNTHROID, LEVOTHROID) tablet 75 mcg  75 mcg Oral QODAY Briscoe Deutscherpyd, Timothy S, MD   75 mcg at 08/29/16 0807  . [START ON 08/30/2016] levothyroxine (SYNTHROID, LEVOTHROID) tablet 88 mcg  88 mcg Oral QODAY Elgergawy, Dawood S, MD      . montelukast (SINGULAIR) tablet 10 mg  10 mg Oral QHS Opyd, Lavone Neriimothy S, MD   10 mg at 08/29/16 0401  . nebivolol (BYSTOLIC) tablet 5 mg  5 mg Oral Daily Opyd, Lavone Neriimothy S, MD   5 mg at 08/29/16 0949  . ondansetron (ZOFRAN) tablet 4 mg  4 mg Oral Q6H PRN Opyd, Lavone Neriimothy S, MD       Or  . ondansetron (ZOFRAN) injection 4 mg  4 mg Intravenous Q6H PRN Opyd, Lavone Neriimothy S, MD      . polyvinyl alcohol (LIQUIFILM TEARS) 1.4 % ophthalmic solution 1-2 drop  1-2 drop Both Eyes Daily Opyd, Lavone Neriimothy S, MD   1 drop at 08/29/16 1000  . potassium phosphate 10 mEq in dextrose 5 % 250 mL infusion  10 mEq Intravenous Once Briscoe Deutscherpyd, Timothy S, MD 42 mL/hr at 08/29/16 0614 10 mEq at 08/29/16 [id number]  . saccharomyces boulardii (FLORASTOR) capsule 250 mg  250 mg Oral Daily Opyd, Lavone Neriimothy S, MD   250 mg at 08/29/16 0949    Allergies as of 08/28/2016 - Review Complete 08/28/2016  Allergen Reaction Noted  . Oxycodone-acetaminophen  07/26/2008    Family History  Problem Relation Age of Onset  . Heart disease Maternal Grandmother   . Diabetes Maternal Grandmother   . Colon cancer Father   . Heart attack Mother   . Diabetes Mother   . Hypertension Brother   . Multiple sclerosis Sister   . Diabetes Sister   . Breast cancer Cousin     Social History   Social History  . Marital status: Married    Spouse name: N/A  . Number of children: N/A  . Years of education: N/A   Occupational History  . Not on file.   Social History Main Topics  . Smoking status: Never Smoker  . Smokeless tobacco: Never  Used  . Alcohol use Yes  . Drug use: No  . Sexual activity: Yes    Partners: Male    Birth control/ protection: IUD     Comment: mirena   Other Topics Concern  . Not on file   Social History Narrative  . No narrative on file    Review of Systems: Positive = bold Gen: Denies any fever, chills, rigors, night sweats, anorexia,  fatigue, weakness, malaise, involuntary weight loss, and sleep disorder CV: Denies chest pain, angina, palpitations, syncope, orthopnea, PND, peripheral edema, and claudication. Resp: Denies dyspnea, cough, sputum, wheezing, coughing up blood. GI: Described in detail in HPI.    GU : Denies urinary burning, blood in urine, urinary frequency, urinary hesitancy, nocturnal urination, and urinary incontinence. MS: Denies joint pain or swelling.  Denies muscle weakness, cramps, atrophy.  Derm: Denies rash, itching, oral ulcerations, hives, unhealing ulcers.  Psych: Denies depression, anxiety, memory loss, suicidal ideation, hallucinations,  and confusion. Heme: Denies bruising, bleeding, and enlarged lymph nodes. Neuro:  Denies any headaches, dizziness, paresthesias. Endo:  Denies any problems with DM, thyroid, adrenal function.  Physical Exam: Vital signs in last 24 hours: Temp:  [98.6 F (37 C)-99.1 F (37.3 C)] 98.6 F (37 C) (06/23 0546) Pulse Rate:  [77-107] 77 (06/23 0546) Resp:  [16-19] 19 (06/23 0546) BP: (111-137)/(72-94) 123/72 (06/23 0546) SpO2:  [97 %-100 %] 100 % (06/23 0546) Weight:  [104.8 kg (231 lb)] 104.8 kg (231 lb) (06/23 0201) Last BM Date: 08/28/16 General:   Alert, overweight, Well-developed, well-nourished, pleasant and cooperative in NAD Head:  Normocephalic and atraumatic. Eyes:  Sclera icteric,   Conjunctiva pink. Ears:  Normal auditory acuity. Nose:  No deformity, discharge,  or lesions. Mouth:  No deformity or lesions.  Oropharynx pink & moist. Neck:  Supple; no masses or thyromegaly. Lungs:  Clear throughout to auscultation.    No wheezes, crackles, or rhonchi. No acute distress. Heart:  Regular rate and rhythm; no murmurs, clicks, rubs,  or gallops. Abdomen:  Soft, nontender and nondistended. No masses, hepatosplenomegaly or hernias noted. Normal bowel sounds, without guarding, and without rebound.     Msk:  Symmetrical without gross deformities. Normal posture. Pulses:  Normal pulses noted. Extremities:  Without clubbing or edema. Neurologic:  Alert and  oriented x4;  grossly normal neurologically. Skin:  Intact without significant lesions or rashes. Psych:  Alert and cooperative. Normal mood and affect.   Lab Results:  Recent Labs  08/28/16 2055 08/29/16 0332  WBC 7.7 6.1  HGB 13.7 12.2  HCT 39.3 36.3  PLT 192 190   BMET  Recent Labs  08/28/16 2055 08/29/16 0332  NA 137 141  K 3.0* 3.0*  CL 106 108  CO2 25 27  GLUCOSE 118* 112*  BUN 5* 5*  CREATININE 0.86 0.74  CALCIUM 8.9 8.1*   LFT  Recent Labs  08/29/16 0332  PROT 5.6*  ALBUMIN 2.7*  AST 1,108*  ALT 1,196*  ALKPHOS 151*  BILITOT 6.2*   PT/INR  Recent Labs  08/28/16 2055 08/29/16 0332  LABPROT 15.9* 16.2*  INR 1.26 1.29    Studies/Results: US Abdomen Limited Ruq  Result Date: 08/28/2016 CLINICAL DATA:  Acute onset of right upper quadrant abdominal pain. Initial encounter. EXAM: ULTRASOUND ABDOMEN LIMITED RIGHT UPPER QUADRANT COMPARISON:  None. FINDINGS: Gallbladder: No gallstones or wall thickening visualized. No sonographic Murphy sign noted by sonographer. Common bile duct: Diameter: 0.3 cm, within normal limits in caliber. Liver: No focal lesion identified. Within normal limits in parenchymal echogenicity. IMPRESSION: Unremarkable ultrasound of the right upper quadrant. Electronically Signed   By: Roanna Raider M.D.   On: 08/28/2016 23:07   Impression:  1.  Elevated LFTs, mixed pattern.  No biliary obstruction on U/S.  Recent start dexlansoprazole, but this is not commonly associated with liver injury.  No other new  medication exposures; erythromycin can cause liver injury, but typically occurs within days to weeks  of starting the medications (she has been on this for years). 2.  Gastroparesis, quiescent at present.  Plan:  1.  Acute and chronic hepatitis panels; autoimmune serologies; daily LFTs and INR. 2.  If patient's LFTs going down tomorrow and no encephalopathy or coagulopathy, might be able to discharge patient home tomorrow with close outpatient follow-up. 3.  [facility] will follow.   LOS: 0 days   Idamae Coccia M  08/29/2016, 10:59 AM  Pager [phone] If no answer or after 5 PM call [phone]

## 2016-08-29 NOTE — H&P (Signed)
History and Physical    Rachel [name] ZOX:[id number] DOB: 1966-02-01 DOA: 08/28/2016  PCP: Coralee Rud, PA-C   Patient coming from: Home  Chief Complaint: Dark urine, light stools, nausea, lab abnormalities  HPI: Rachel [name] is a 51 y.o. female with medical history significant for gastroparesis, hypertension, and hypothyroidism, presenting to the emergency department for evaluation of lab abnormalities identified by her PCP. Patient reports that she had been in her usual state of health until several days ago when she noted that her urine had become more dark and her stools became more light in color. She also developed some mild nausea around the same time. These symptoms have been persistent, and she brought it up with her PCP when she is being seen for a routine physical. Basic blood work was obtained and the patient was notified of significant abnormalities in her LFTs and was advised to seek further evaluation in the emergency department. Patient denies any recent long distance travel or sick contacts. She denies fevers or chills. She denies any significant alcohol use or use of acetaminophen-containing medications. Denies any history of blood transfusion or IV drug abuse. Denies hematochezia or melena, and denies vomiting.  ED Course: Upon arrival to the ED, patient is found to be afebrile, saturating well on room air, slightly tachycardic, and with vitals otherwise stable. Chemistry panels notable for potassium 3.0, alkaline phosphatase 162, AST 1233, ALT 1282, and total bilirubin 7.3. CBC is within the normal limits, acetaminophen level was undetectable, and INR is elevated to 1.26. Right upper quadrant ultrasound is unremarkable. Patient was given a liter of normal saline and acute hepatitis panel was sent and remains pending. Gastroenterology was consulted by the ED physician and advised checking the acute hepatitis panel, hydrating the patient, and admitting to medicine with GI to  consult in the morning. Patient remained hemodynamically stable in the ED with resolution of the tachycardia with IV fluid. She will be observed on medical surgical unit for ongoing evaluation and management of acute hepatitis of unknown etiology.  Review of Systems:  All other systems reviewed and apart from HPI, are negative.  Past Medical History:  Diagnosis Date  . Abnormal pap   . Abnormal Pap smear   . Gastroparesis   . Hypertension   . Thyroid disease    "nodule"    Past Surgical History:  Procedure Laterality Date  . COLONOSCOPY    . HERNIA REPAIR    . JOINT REPLACEMENT    . KNEE SURGERY    . MYOMECTOMY    . THROAT SURGERY       reports that she has never smoked. She has never used smokeless tobacco. She reports that she drinks alcohol. She reports that she does not use drugs.  Allergies  Allergen Reactions  . Oxycodone-Acetaminophen Nausea And Vomiting    Family History  Problem Relation Age of Onset  . Heart disease Maternal Grandmother   . Diabetes Maternal Grandmother   . Colon cancer Father   . Heart attack Mother   . Diabetes Mother   . Hypertension Brother   . Multiple sclerosis Sister   . Diabetes Sister   . Breast cancer Cousin      Prior to Admission medications   Medication Sig Start Date End Date Taking? Authorizing Provider  albuterol (PROVENTIL HFA;VENTOLIN HFA) 108 (90 Base) MCG/ACT inhaler Inhale 1-2 puffs into the lungs every 6 (six) hours as needed for wheezing or shortness of breath.   Yes [provider]  CALCIUM-MAGNESIUM-ZINC PO Take 1 tablet by mouth daily.   Yes [provider]  cholecalciferol (VITAMIN D) 1000 UNITS tablet Take 1,000 Units by mouth daily.   Yes [provider]  ciprofloxacin (CIPRO) 500 MG tablet Take 500 mg by mouth 2 (two) times daily.   Yes [provider]  cycloSPORINE (RESTASIS) 0.05 % ophthalmic emulsion Place 1 drop into both eyes 2 (two) times daily as needed (dryness).     Yes [provider]  dexlansoprazole (DEXILANT) 60 MG capsule Take 60 mg by mouth daily.   Yes [provider]  erythromycin base (E-MYCIN) 500 MG tablet Take 500 mg by mouth 2 (two) times daily.   Yes [provider]  fish oil-omega-3 fatty acids 1000 MG capsule Take 2 g by mouth daily.   Yes [provider]  hydroxypropyl methylcellulose / hypromellose (ISOPTO TEARS / GONIOVISC) 2.5 % ophthalmic solution Place 1 drop into both eyes 3 (three) times daily as needed for dry eyes.   Yes [provider]  levothyroxine (SYNTHROID, LEVOTHROID) 75 MCG tablet Take 75 mcg by mouth every other day.    Yes [provider]  levothyroxine (SYNTHROID, LEVOTHROID) 88 MCG tablet Take 88 mcg by mouth every other day.    Yes [provider]  montelukast (SINGULAIR) 10 MG tablet Take 10 mg by mouth at bedtime.   Yes [provider]  nebivolol (BYSTOLIC) 5 MG tablet Take 5 mg by mouth daily.   Yes [provider]  olmesartan-hydrochlorothiazide (BENICAR HCT) 40-12.5 MG tablet Take 1 tablet by mouth daily.   Yes [provider]  Propylene Glycol (SYSTANE BALANCE) 0.6 % SOLN Place 1-2 drops into both eyes daily.   Yes [provider]  saccharomyces boulardii (FLORASTOR) 250 MG capsule Take 250 mg by mouth daily.    Yes [provider]    Physical Exam: Vitals:   08/29/16 0000 08/29/16 0030 08/29/16 0100 08/29/16 0130  BP: 120/74 126/81 119/75 111/72  Pulse: 85 85 86 78  Resp:      Temp:      TempSrc:      SpO2: 100% 100% 100% 100%  Weight:      Height:          Constitutional: NAD, calm, comfortable Eyes: PERTLA, scleral icterus ENMT: Mucous membranes are moist. Posterior pharynx clear of any exudate or lesions.   Neck: normal, supple, no masses, no thyromegaly Respiratory: clear to auscultation bilaterally, no wheezing, no crackles. Normal respiratory effort.    Cardiovascular: Rate ~100 and  regular. No extremity edema. No significant JVD. Abdomen: No distension, no tenderness, no masses palpated. Bowel sounds normal.  Musculoskeletal: no clubbing / cyanosis. No joint deformity upper and lower extremities.   Skin: no significant rashes, lesions, ulcers. Warm, dry, well-perfused. Neurologic: CN 2-12 grossly intact. Sensation intact, DTR normal. Strength 5/5 in all 4 limbs.  Psychiatric: Alert and oriented x 3. Calm and cooperative.     Labs on Admission: I have personally reviewed following labs and imaging studies  CBC:  Recent Labs Lab 08/28/16 2055  WBC 7.7  NEUTROABS 2.9  HGB 13.7  HCT 39.3  MCV 80.5  PLT 192   Basic Metabolic Panel:  Recent Labs Lab 08/28/16 2055  NA 137  K 3.0*  CL 106  CO2 25  GLUCOSE 118*  BUN 5*  CREATININE 0.86  CALCIUM 8.9   GFR: Estimated Creatinine Clearance: 95.3 mL/min (by C-G formula based on SCr of 0.86 mg/dL). Liver Function Tests:  Recent Labs Lab 08/28/16 2055  AST 1,233*  ALT 1,287*  ALKPHOS 162*  BILITOT 7.3*  PROT 6.4*  ALBUMIN 3.1*    Recent Labs Lab 08/28/16 2055  LIPASE 30   No results for input(s): AMMONIA in the last 168 hours. Coagulation Profile:  Recent Labs Lab 08/28/16 2055  INR 1.26   Cardiac Enzymes: No results for input(s): CKTOTAL, CKMB, CKMBINDEX, TROPONINI in the last 168 hours. BNP (last 3 results) No results for input(s): PROBNP in the last 8760 hours. HbA1C: No results for input(s): HGBA1C in the last 72 hours. CBG: No results for input(s): GLUCAP in the last 168 hours. Lipid Profile: No results for input(s): CHOL, HDL, LDLCALC, TRIG, CHOLHDL, LDLDIRECT in the last 72 hours. Thyroid Function Tests: No results for input(s): TSH, T4TOTAL, FREET4, T3FREE, THYROIDAB in the last 72 hours. Anemia Panel: No results for input(s): VITAMINB12, FOLATE, FERRITIN, TIBC, IRON, RETICCTPCT in the last 72 hours. Urine analysis:    Component Value Date/Time   COLORURINE YELLOW  09/14/2014 1847   APPEARANCEUR CLEAR 09/14/2014 1847   LABSPEC 1.014 09/14/2014 1847   PHURINE 5.0 09/14/2014 1847   GLUCOSEU NEGATIVE 09/14/2014 1847   HGBUR NEGATIVE 09/14/2014 1847   BILIRUBINUR NEGATIVE 09/14/2014 1847   KETONESUR NEGATIVE 09/14/2014 1847   PROTEINUR NEGATIVE 09/14/2014 1847   UROBILINOGEN 0.2 09/14/2014 1847   NITRITE NEGATIVE 09/14/2014 1847   LEUKOCYTESUR NEGATIVE 09/14/2014 1847   Sepsis Labs: @LABRCNTIP (procalcitonin:4,lacticidven:4) )No results found for this or any previous visit (from the past 240 hour(s)).   Radiological Exams on Admission: US Abdomen Limited Ruq  Result Date: 08/28/2016 CLINICAL DATA:  Acute onset of right upper quadrant abdominal pain. Initial encounter. EXAM: ULTRASOUND ABDOMEN LIMITED RIGHT UPPER QUADRANT COMPARISON:  None. FINDINGS: Gallbladder: No gallstones or wall thickening visualized. No sonographic Murphy sign noted by sonographer. Common bile duct: Diameter: 0.3 cm, within normal limits in caliber. Liver: No focal lesion identified. Within normal limits in parenchymal echogenicity. IMPRESSION: Unremarkable ultrasound of the right upper quadrant. Electronically Signed   By: Roanna Raider M.D.   On: 08/28/2016 23:07    EKG: Not performed.   Assessment/Plan  1. Acute hepatitis  - Pt presents at direction of PCP for eval of LFT abnormalities  - She noted darkened urine and light stool recently, but no pain, fevers, vomiting, or diarrhea  - Denies alcohol or acetaminophen use, no risk-factors for viral hepatitis identified  - GI consulting and much appreciated; will follow-up on recommendations - Plan to hydrate, monitor and correct lytes, start H2-blocker, trend LFT's, check viral hepatitis panel   2. Hypokalemia  - Serum potassium is 3.0 on admission - She was treated with 20 mEq oral, and 10 mEq IV potassium  - Repeat CMP in am    3. UTI - Pt currently under treatment with Cipro for UTI - No fever or leukocytosis  -  Send urine for culture, continue Cipro to complete course    4. Gastroparesis  - Stable  - Continue erythromycin    5. Hypertension  - BP at goal  - Continue nebivolol  - Hold Benicar in light of hypokalemia    6. Hypothyroidism  - Continue Synthroid     DVT prophylaxis: sq Lovenox Code Status: Full  Family Communication: Sister and husband updated at bedside Disposition Plan: Observe on med-surg Consults called: Gastroenterology Admission status: Observation    Briscoe Deutscher, MD [facility] Pager [phone]  If 7PM-7AM, please contact night-coverage [email or web address] Password [email or web address]  08/29/2016, 1:54 AM

## 2016-08-30 DIAGNOSIS — I10 Essential (primary) hypertension: Secondary | ICD-10-CM | POA: Diagnosis not present

## 2016-08-30 DIAGNOSIS — R740 Nonspecific elevation of levels of transaminase and lactic acid dehydrogenase [LDH]: Secondary | ICD-10-CM

## 2016-08-30 DIAGNOSIS — N390 Urinary tract infection, site not specified: Secondary | ICD-10-CM | POA: Diagnosis not present

## 2016-08-30 DIAGNOSIS — R7401 Elevation of levels of liver transaminase levels: Secondary | ICD-10-CM

## 2016-08-30 DIAGNOSIS — B179 Acute viral hepatitis, unspecified: Secondary | ICD-10-CM | POA: Diagnosis not present

## 2016-08-30 LAB — COMPREHENSIVE METABOLIC PANEL
ALBUMIN: 2.3 g/dL — AB (ref 3.5–5.0)
ALT: 1033 U/L — ABNORMAL HIGH (ref 14–54)
ANION GAP: 4 — AB (ref 5–15)
AST: 1045 U/L — ABNORMAL HIGH (ref 15–41)
Alkaline Phosphatase: 135 U/L — ABNORMAL HIGH (ref 38–126)
BUN: <5 mg/dL — ABNORMAL LOW (ref 6–20)
CALCIUM: 8.2 mg/dL — AB (ref 8.9–10.3)
CO2: 25 mmol/L (ref 22–32)
Chloride: 111 mmol/L (ref 101–111)
Creatinine, Ser: 0.77 mg/dL (ref 0.44–1.00)
GFR calc non Af Amer: >60 mL/min (ref >60)
GLUCOSE: 86 mg/dL (ref 65–99)
POTASSIUM: 3.7 mmol/L (ref 3.5–5.1)
SODIUM: 140 mmol/L (ref 135–145)
Total Bilirubin: 6.4 mg/dL — ABNORMAL HIGH (ref 0.3–1.2)
Total Protein: 5.1 g/dL — ABNORMAL LOW (ref 6.5–8.1)

## 2016-08-30 LAB — MITOCHONDRIAL ANTIBODIES: MITOCHONDRIAL M2 AB, IGG: 5.9 U (ref 0.0–20.0)

## 2016-08-30 LAB — CERULOPLASMIN: CERULOPLASMIN: 31.1 mg/dL (ref 19.0–39.0)

## 2016-08-30 LAB — IGG, IGA, IGM
IGM, SERUM: 51 mg/dL (ref 26–217)
IgA: 214 mg/dL (ref 87–352)
IgG (Immunoglobin G), Serum: 1320 mg/dL (ref 700–1600)

## 2016-08-30 LAB — PROTIME-INR
INR: 1.43
Prothrombin Time: 17.6 seconds — ABNORMAL HIGH (ref 11.4–15.2)

## 2016-08-30 LAB — GLUCOSE, CAPILLARY: GLUCOSE-CAPILLARY: 91 mg/dL (ref 65–99)

## 2016-08-30 LAB — HEPATITIS B CORE ANTIBODY, TOTAL: Hep B Core Total Ab: NEGATIVE

## 2016-08-30 LAB — ANTI-SMOOTH MUSCLE ANTIBODY, IGG: F-ACTIN AB IGG: 30 U — AB (ref 0–19)

## 2016-08-30 LAB — EBV AB TO VIRAL CAPSID AG PNL, IGG+IGM

## 2016-08-30 LAB — HEPATITIS B SURFACE ANTIBODY,QUALITATIVE: Hep B S Ab: NONREACTIVE

## 2016-08-30 LAB — HEPATITIS A ANTIBODY, TOTAL: Hep A Total Ab: NEGATIVE

## 2016-08-30 NOTE — Discharge Instructions (Signed)
Follow with Primary MD Coralee Ruduran, Michael R, PA-C in 7 days   Get CBC, CMP, checked  by Primary MD next visit.    Activity: As tolerated    Disposition Home    Diet: Heart Healthy   On your next visit with your primary care physician please Get Medicines reviewed and adjusted.   Please request your Prim.MD to go over all Hospital Tests and Procedure/Radiological results at the follow up, please get all Hospital records sent to your Prim MD by signing hospital release before you go home.   If you experience worsening of your admission symptoms, develop shortness of breath, life threatening emergency, suicidal or homicidal thoughts you must seek medical attention immediately by calling 911 or calling your MD immediately  if symptoms less severe.  You Must read complete instructions/literature along with all the possible adverse reactions/side effects for all the Medicines you take and that have been prescribed to you. Take any new Medicines after you have completely understood and accpet all the possible adverse reactions/side effects.   Do not drive, operating heavy machinery, perform activities at heights, swimming or participation in water activities or provide baby sitting services if your were admitted for syncope or siezures until you have seen by Primary MD or a Neurologist and advised to do so again.  Do not drive when taking Pain medications.    Do not take more than prescribed Pain, Sleep and Anxiety Medications  Special Instructions: If you have smoked or chewed Tobacco  in the last 2 yrs please stop smoking, stop any regular Alcohol  and or any Recreational drug use.  Wear Seat belts while driving.   Please note  You were cared for by a hospitalist during your hospital stay. If you have any questions about your discharge medications or the care you received while you were in the hospital after you are discharged, you can call the unit and asked to speak with the  hospitalist on call if the hospitalist that took care of you is not available. Once you are discharged, your primary care physician will handle any further medical issues. Please note that NO REFILLS for any discharge medications will be authorized once you are discharged, as it is imperative that you return to your primary care physician (or establish a relationship with a primary care physician if you do not have one) for your aftercare needs so that they can reassess your need for medications and monitor your lab values.

## 2016-08-30 NOTE — Progress Notes (Signed)
Essie Harthelma P Schryver to be D/C'd  per MD order. Discussed with the patient and all questions fully answered.  VSS, Skin clean, dry and intact without evidence of skin break down, no evidence of skin tears noted.  IV catheter discontinued intact. Site without signs and symptoms of complications. Dressing and pressure applied.  An After Visit Summary was printed and given to the patient. Patient received prescription.  D/c education completed with patient/family including follow up instructions, medication list, d/c activities limitations if indicated, with other d/c instructions as indicated by MD - patient able to verbalize understanding, all questions fully answered.   Patient instructed to return to ED, call 911, or call MD for any changes in condition.   Patient walked out with husband, and D/C home via private auto.

## 2016-08-30 NOTE — Discharge Summary (Signed)
Rachel Harthelma P Mura, is a 51 y.o. female  DOB 03-23-1965  MRN [id number].  Admission date:  08/28/2016  Admitting Physician  Briscoe Deutscherimothy S Opyd, MD  Discharge Date:  08/30/2016   Primary MD  Coralee Ruduran, Michael R, PA-C  Recommendations for primary care physician for things to follow:  - Please check CBC, CMP , INR during next visit - patient to follow with [facility] regarding further labs and final results of transaminitis workup - Follow on final results of urine culture obtained during hospital stay  Admission Diagnosis  Acute hepatitis [B17.9] RUQ pain [R10.11]   Discharge Diagnosis  Acute hepatitis [B17.9] RUQ pain [R10.11]    Principal Problem:   Acute hepatitis Active Problems:   Jaundice   Hypokalemia   Hypothyroidism   Hypertension   Acute lower UTI      Past Medical History:  Diagnosis Date  . Abnormal pap   . Abnormal Pap smear   . Gastroparesis   . Hypertension   . Thyroid disease    "nodule"    Past Surgical History:  Procedure Laterality Date  . COLONOSCOPY    . HERNIA REPAIR    . JOINT REPLACEMENT    . KNEE SURGERY    . MYOMECTOMY    . THROAT SURGERY         History of present illness and  Hospital Course:     Kindly see H&P for history of present illness and admission details, please review complete Labs, Consult reports and Test reports for all details in brief  HPI  from the history and physical done on the day of admission 08/29/2016  HPI: Rachel [name] is a 51 y.o. female with medical history significant for gastroparesis, hypertension, and hypothyroidism, presenting to the emergency department for evaluation of lab abnormalities identified by her PCP. Patient reports that she had been in her usual state of health until several days ago when she noted that her urine had become more dark and her stools became more light in color. She also developed some mild nausea around  the same time. These symptoms have been persistent, and she brought it up with her PCP when she is being seen for a routine physical. Basic blood work was obtained and the patient was notified of significant abnormalities in her LFTs and was advised to seek further evaluation in the emergency department. Patient denies any recent long distance travel or sick contacts. She denies fevers or chills. She denies any significant alcohol use or use of acetaminophen-containing medications. Denies any history of blood transfusion or IV drug abuse. Denies hematochezia or melena, and denies vomiting.  ED Course: Upon arrival to the ED, patient is found to be afebrile, saturating well on room air, slightly tachycardic, and with vitals otherwise stable. Chemistry panels notable for potassium 3.0, alkaline phosphatase 162, AST 1233, ALT 1282, and total bilirubin 7.3. CBC is within the normal limits, acetaminophen level was undetectable, and INR is elevated to 1.26. Right upper quadrant ultrasound is unremarkable. Patient was given a  liter of normal saline and acute hepatitis panel was sent and remains pending. Gastroenterology was consulted by the ED physician and advised checking the acute hepatitis panel, hydrating the patient, and admitting to medicine with GI to consult in the morning. Patient remained hemodynamically stable in the ED with resolution of the tachycardia with IV fluid. She will be observed on medical surgical unit for ongoing evaluation and management of acute hepatitis of unknown etiology.   Hospital Course   Elevated LFTs  - Patient was seen by GI, with significantly elevated LFTs, mixed pattern, but predominantly hepatocellular pattern, right upper quadrant ultrasound with no acute findings, autoimmune workup pending at time of discharge, hepatitis panel still pending at time of discharge, opacity, with INR of 1.4, patient is stable for discharge from GI, will stop new medication including  ranitidine and Dexilant on discharge per GI recommendation, he will arrange for follow-up appointment/labs next week .  Hypokalemia  - Repleted  UTI - Pt currently under treatment with Cipro for UTI - No fever or leukocytosis  - Then sent for culture during hospital stay, follow final results, continue Cipro to complete course    Gastroparesis  - Stable  - Continue erythromycin   Hypertension  - Acceptable, continue with home medication  Hypothyroidism  - Continue with Synthroid    Discharge Condition:  stable   Follow UP  Follow-up Information    Coralee Rud, PA-C Follow up in 1 week(s).   Specialty:  Cardiology Contact information: [facility]  [address] [place] [address] [phone]        Dorena Cookey, MD Follow up.   Specialty:  Gastroenterology Why:  You will be called with follow-up appointment/labs Contact information: [address] [place] [address] [phone]             Discharge Instructions  and  Discharge Medications    Discharge Instructions    Discharge instructions    Complete by:  As directed    Follow with Primary MD Coralee Rud, PA-C in 7 days   Get CBC, CMP, checked  by Primary MD next visit.    Activity: As tolerated    Disposition Home    Diet: Heart Healthy   On your next visit with your primary care physician please Get Medicines reviewed and adjusted.   Please request your Prim.MD to go over all Hospital Tests and Procedure/Radiological results at the follow up, please get all Hospital records sent to your Prim MD by signing hospital release before you go home.   If you experience worsening of your admission symptoms, develop shortness of breath, life threatening emergency, suicidal or homicidal thoughts you must seek medical attention immediately by calling 911 or calling your MD immediately  if symptoms less severe.  You Must read complete  instructions/literature along with all the possible adverse reactions/side effects for all the Medicines you take and that have been prescribed to you. Take any new Medicines after you have completely understood and accpet all the possible adverse reactions/side effects.   Do not drive, operating heavy machinery, perform activities at heights, swimming or participation in water activities or provide baby sitting services if your were admitted for syncope or siezures until you have seen by Primary MD or a Neurologist and advised to do so again.  Do not drive when taking Pain medications.    Do not take more than prescribed Pain, Sleep and Anxiety Medications  Special Instructions: If you  have smoked or chewed Tobacco  in the last 2 yrs please stop smoking, stop any regular Alcohol  and or any Recreational drug use.  Wear Seat belts while driving.   Please note  You were cared for by a hospitalist during your hospital stay. If you have any questions about your discharge medications or the care you received while you were in the hospital after you are discharged, you can call the unit and asked to speak with the hospitalist on call if the hospitalist that took care of you is not available. Once you are discharged, your primary care physician will handle any further medical issues. Please note that NO REFILLS for any discharge medications will be authorized once you are discharged, as it is imperative that you return to your primary care physician (or establish a relationship with a primary care physician if you do not have one) for your aftercare needs so that they can reassess your need for medications and monitor your lab values.   Increase activity slowly    Complete by:  As directed      Allergies as of 08/30/2016      Reactions   Oxycodone-acetaminophen Nausea And Vomiting      Medication List    STOP taking these medications   DEXILANT 60 MG capsule Generic drug:  dexlansoprazole      TAKE these medications   albuterol 108 (90 Base) MCG/ACT inhaler Commonly known as:  PROVENTIL HFA;VENTOLIN HFA Inhale 1-2 puffs into the lungs every 6 (six) hours as needed for wheezing or shortness of breath.   CALCIUM-MAGNESIUM-ZINC PO Take 1 tablet by mouth daily.   cholecalciferol 1000 units tablet Commonly known as:  VITAMIN D Take 1,000 Units by mouth daily.   ciprofloxacin 500 MG tablet Commonly known as:  CIPRO Take 500 mg by mouth 2 (two) times daily.   cycloSPORINE 0.05 % ophthalmic emulsion Commonly known as:  RESTASIS Place 1 drop into both eyes 2 (two) times daily as needed (dryness).   erythromycin base 500 MG tablet Commonly known as:  E-MYCIN Take 500 mg by mouth 2 (two) times daily.   fish oil-omega-3 fatty acids 1000 MG capsule Take 2 g by mouth daily.   hydroxypropyl methylcellulose / hypromellose 2.5 % ophthalmic solution Commonly known as:  ISOPTO TEARS / GONIOVISC Place 1 drop into both eyes 3 (three) times daily as needed for dry eyes.   levothyroxine 75 MCG tablet Commonly known as:  SYNTHROID, LEVOTHROID Take 75 mcg by mouth every other day.   levothyroxine 88 MCG tablet Commonly known as:  SYNTHROID, LEVOTHROID Take 88 mcg by mouth every other day.   montelukast 10 MG tablet Commonly known as:  SINGULAIR Take 10 mg by mouth at bedtime.   nebivolol 5 MG tablet Commonly known as:  BYSTOLIC Take 5 mg by mouth daily.   olmesartan-hydrochlorothiazide 40-12.5 MG tablet Commonly known as:  BENICAR HCT Take 1 tablet by mouth daily.   saccharomyces boulardii 250 MG capsule Commonly known as:  FLORASTOR Take 250 mg by mouth daily.   SYSTANE BALANCE 0.6 % Soln Generic drug:  Propylene Glycol Place 1-2 drops into both eyes daily.         Diet and Activity recommendation: See Discharge Instructions above   Consults obtained -  [facility] Dr Dulce Sellar   Major procedures and Radiology Reports - PLEASE review detailed and final reports  for all details, in brief -    US Abdomen Limited Ruq  Result Date: 08/28/2016  CLINICAL DATA:  Acute onset of right upper quadrant abdominal pain. Initial encounter. EXAM: ULTRASOUND ABDOMEN LIMITED RIGHT UPPER QUADRANT COMPARISON:  None. FINDINGS: Gallbladder: No gallstones or wall thickening visualized. No sonographic Murphy sign noted by sonographer. Common bile duct: Diameter: 0.3 cm, within normal limits in caliber. Liver: No focal lesion identified. Within normal limits in parenchymal echogenicity. IMPRESSION: Unremarkable ultrasound of the right upper quadrant. Electronically Signed   By: Roanna Raider M.D.   On: 08/28/2016 23:07    Micro Results     No results found for this or any previous visit (from the past 240 hour(s)).     Today   Subjective:   Markan Cazarez today has no headache,no chest or abdominal pain, no nausea or vomiting . Objective:   Blood pressure 119/68, pulse 74, temperature 98.6 F (37 C), temperature source Oral, resp. rate 18, height 5\' 7"  (1.702 m), weight 104.8 kg (231 lb), SpO2 100 %.   Intake/Output Summary (Last 24 hours) at 08/30/16 1122 Last data filed at 08/30/16 0117  Gross per 24 hour  Intake              770 ml  Output              500 ml  Net              270 ml    Exam Awake Alert, Oriented x 3, No new F.N deficits, Normal affect Icteric sclera  Symmetrical Chest wall movement, Good air movement bilaterally, CTAB RRR,No Gallops,Rubs or new Murmurs, No Parasternal Heave +ve B.Sounds, Abd Soft, Non tender, No rebound -guarding or rigidity. No Cyanosis, Clubbing or edema, No new Rash or bruise  Data Review   CBC w Diff: Lab Results  Component Value Date   WBC 6.1 08/29/2016   HGB 12.2 08/29/2016   HCT 36.3 08/29/2016   PLT 190 08/29/2016   LYMPHOPCT 49 08/28/2016   MONOPCT 13 08/28/2016   EOSPCT 0 08/28/2016   BASOPCT 0 08/28/2016    CMP: Lab Results  Component Value Date   NA 140 08/30/2016   K 3.7 08/30/2016   CL  111 08/30/2016   CO2 25 08/30/2016   BUN <5 (L) 08/30/2016   CREATININE 0.77 08/30/2016   PROT 5.1 (L) 08/30/2016   ALBUMIN 2.3 (L) 08/30/2016   BILITOT 6.4 (H) 08/30/2016   ALKPHOS 135 (H) 08/30/2016   AST 1,045 (H) 08/30/2016   ALT 1,033 (H) 08/30/2016  .   Total Time in preparing paper work, data evaluation and todays exam - 35 minutes  Kolbee Bogusz M.D on 08/30/2016 at 11:22 AM  [facility]   Office  [phone]

## 2016-08-30 NOTE — Progress Notes (Signed)
Subjective: No abdominal pain.  Objective: Vital signs in last 24 hours: Temp:  [98.4 F (36.9 C)-98.6 F (37 C)] 98.6 F (37 C) (06/24 0604) Pulse Rate:  [72-75] 74 (06/24 0604) Resp:  [18-19] 18 (06/24 0604) BP: (119-124)/(68-80) 119/68 (06/24 0604) SpO2:  [100 %] 100 % (06/24 0604) Weight change:  Last BM Date: 08/29/16  PE: GEN:  Overweight HEENT:  Scleral icterus  Lab Results: CBC    Component Value Date/Time   WBC 6.1 08/29/2016 0332   RBC 4.47 08/29/2016 0332   HGB 12.2 08/29/2016 0332   HCT 36.3 08/29/2016 0332   PLT 190 08/29/2016 0332   MCV 81.2 08/29/2016 0332   MCH 27.3 08/29/2016 0332   MCHC 33.6 08/29/2016 0332   RDW 16.9 (H) 08/29/2016 0332   LYMPHSABS 3.8 08/28/2016 2055   MONOABS 1.0 08/28/2016 2055   EOSABS 0.0 08/28/2016 2055   BASOSABS 0.0 08/28/2016 2055   CMP     Component Value Date/Time   NA 140 08/30/2016 0505   K 3.7 08/30/2016 0505   CL 111 08/30/2016 0505   CO2 25 08/30/2016 0505   GLUCOSE 86 08/30/2016 0505   BUN <5 (L) 08/30/2016 0505   CREATININE 0.77 08/30/2016 0505   CALCIUM 8.2 (L) 08/30/2016 0505   PROT 5.1 (L) 08/30/2016 0505   ALBUMIN 2.3 (L) 08/30/2016 0505   AST 1,045 (H) 08/30/2016 0505   ALT 1,033 (H) 08/30/2016 0505   ALKPHOS 135 (H) 08/30/2016 0505   BILITOT 6.4 (H) 08/30/2016 0505   GFRNONAA >60 08/30/2016 0505   GFRAA >60 08/30/2016 0505   INR 1.43  Assessment:  1.  Significantly elevated LFTs, mixed but predominantly hepatocellular pattern, serologic work-up pending, no encephalopathy.  Mild coagulopathy likely reflective of cholestasis rather than liver failure.    Plan:  1.  Hold any of her new recent medications (ranitidine, dexlansoprazole). 2.  Awaiting serologies. 3.  OK for patient to discharge home with close outpatient follow-up next week for symptom assessment, follow-up of labs, and repeat LFTs and PT/INR. 4.  [facility] will sign-off.   Freddy JakschOUTLAW,Briget Shaheed M 08/30/2016, 10:55 AM   Pager  [phone] If no answer or after 5 PM call [phone]

## 2016-08-31 LAB — HEPATITIS PANEL, ACUTE
HCV Ab: 0.1 s/co ratio (ref 0.0–0.9)
HEP A IGM: NEGATIVE
HEP B C IGM: NEGATIVE
HEP B S AG: NEGATIVE

## 2016-09-01 ENCOUNTER — Other Ambulatory Visit (HOSPITAL_COMMUNITY): Payer: Self-pay | Admitting: Gastroenterology

## 2016-09-01 DIAGNOSIS — R945 Abnormal results of liver function studies: Principal | ICD-10-CM

## 2016-09-01 DIAGNOSIS — R7989 Other specified abnormal findings of blood chemistry: Secondary | ICD-10-CM

## 2016-09-01 LAB — HSV(HERPES SMPLX)ABS-I+II(IGG+IGM)-BLD
HSV 1 GLYCOPROTEIN G AB, IGG: 26.9 {index} — AB (ref 0.00–0.90)
HSV 2 Glycoprotein G Ab, IgG: <0.91 index (ref 0.00–0.90)
HSVI/II Comb IgM: 1.06 Ratio — ABNORMAL HIGH (ref 0.00–0.90)

## 2016-09-03 LAB — URINE CULTURE

## 2016-09-04 ENCOUNTER — Other Ambulatory Visit: Payer: Self-pay | Admitting: Radiology

## 2016-09-07 ENCOUNTER — Encounter (HOSPITAL_COMMUNITY): Payer: Self-pay

## 2016-09-07 ENCOUNTER — Ambulatory Visit (HOSPITAL_COMMUNITY)
Admission: RE | Admit: 2016-09-07 | Discharge: 2016-09-07 | Disposition: A | Discharge status: Home / Self Care | Payer: BLUE CROSS/BLUE SHIELD | Source: Ambulatory Visit | Attending: Gastroenterology | Admitting: Gastroenterology

## 2016-09-07 DIAGNOSIS — E079 Disorder of thyroid, unspecified: Secondary | ICD-10-CM | POA: Diagnosis not present

## 2016-09-07 DIAGNOSIS — R945 Abnormal results of liver function studies: Secondary | ICD-10-CM

## 2016-09-07 DIAGNOSIS — R7989 Other specified abnormal findings of blood chemistry: Secondary | ICD-10-CM | POA: Insufficient documentation

## 2016-09-07 DIAGNOSIS — Z79899 Other long term (current) drug therapy: Secondary | ICD-10-CM | POA: Insufficient documentation

## 2016-09-07 DIAGNOSIS — K739 Chronic hepatitis, unspecified: Secondary | ICD-10-CM | POA: Diagnosis not present

## 2016-09-07 DIAGNOSIS — K3184 Gastroparesis: Secondary | ICD-10-CM | POA: Insufficient documentation

## 2016-09-07 DIAGNOSIS — K740 Hepatic fibrosis: Secondary | ICD-10-CM | POA: Diagnosis not present

## 2016-09-07 DIAGNOSIS — I10 Essential (primary) hypertension: Secondary | ICD-10-CM | POA: Diagnosis not present

## 2016-09-07 LAB — CBC
HCT: 43.8 % (ref 36.0–46.0)
Hemoglobin: 14.9 g/dL (ref 12.0–15.0)
MCH: 27.5 pg (ref 26.0–34.0)
MCHC: 34 g/dL (ref 30.0–36.0)
MCV: 81 fL (ref 78.0–100.0)
PLATELETS: 265 10*3/uL (ref 150–400)
RBC: 5.41 MIL/uL — ABNORMAL HIGH (ref 3.87–5.11)
RDW: 19.4 % — AB (ref 11.5–15.5)
WBC: 14.9 10*3/uL — AB (ref 4.0–10.5)

## 2016-09-07 LAB — APTT: APTT: 28 s (ref 24–36)

## 2016-09-07 LAB — PROTIME-INR
INR: 1.34
Prothrombin Time: 16.7 seconds — ABNORMAL HIGH (ref 11.4–15.2)

## 2016-09-07 MED ORDER — MIDAZOLAM HCL 2 MG/2ML IJ SOLN
INTRAMUSCULAR | Status: AC
  Filled 2016-09-07: qty 4

## 2016-09-07 MED ORDER — SODIUM CHLORIDE 0.9 % IV SOLN
INTRAVENOUS | Status: DC
  Administered 2016-09-07: 13:00:00 via INTRAVENOUS

## 2016-09-07 MED ORDER — MIDAZOLAM HCL 2 MG/2ML IJ SOLN
INTRAMUSCULAR | Status: AC | PRN
  Administered 2016-09-07 (×2): 0.5 mg via INTRAVENOUS
  Administered 2016-09-07: 1 mg via INTRAVENOUS

## 2016-09-07 MED ORDER — FENTANYL CITRATE (PF) 100 MCG/2ML IJ SOLN
INTRAMUSCULAR | Status: AC | PRN
  Administered 2016-09-07 (×2): 50 ug via INTRAVENOUS

## 2016-09-07 MED ORDER — FENTANYL CITRATE (PF) 100 MCG/2ML IJ SOLN
INTRAMUSCULAR | Status: AC
  Filled 2016-09-07: qty 2

## 2016-09-07 NOTE — Sedation Documentation (Signed)
Patient is resting comfortably. 

## 2016-09-07 NOTE — Sedation Documentation (Signed)
Patient denies pain and is resting comfortably.  

## 2016-09-07 NOTE — Consult Note (Signed)
Chief Complaint: Patient was seen in consultation today for image guided random core liver biopsy  Referring Physician(s): Outlaw,William  Supervising Physician: Irish Lack  Patient Status: [facility] - Out-pt  History of Present Illness: Rachel [name] is a 51 y.o. female with history of elevated liver function tests as well as elevated anti smooth muscle antibody who presents today for image guided random core liver biopsy for further evaluation.  Past Medical History:  Diagnosis Date  . Abnormal pap   . Abnormal Pap smear   . Gastroparesis   . Hypertension   . Thyroid disease    "nodule"    Past Surgical History:  Procedure Laterality Date  . COLONOSCOPY    . HERNIA REPAIR    . JOINT REPLACEMENT    . KNEE SURGERY    . MYOMECTOMY    . THROAT SURGERY      Allergies: Oxycodone-acetaminophen  Medications: Prior to Admission medications   Medication Sig Start Date End Date Taking? Authorizing Provider  Budesonide (UCERIS) 9 MG TB24 Take 1 tablet by mouth daily.   Yes [provider]  CALCIUM-MAGNESIUM-ZINC PO Take 1 tablet by mouth daily.   Yes [provider]  cholecalciferol (VITAMIN D) 1000 UNITS tablet Take 1,000 Units by mouth daily.   Yes [provider]  cycloSPORINE (RESTASIS) 0.05 % ophthalmic emulsion Place 1 drop into both eyes 2 (two) times daily as needed (dryness).    Yes [provider]  erythromycin base (E-MYCIN) 500 MG tablet Take 500 mg by mouth 2 (two) times daily.   Yes [provider]  fish oil-omega-3 fatty acids 1000 MG capsule Take 2 g by mouth daily.   Yes [provider]  levothyroxine (SYNTHROID, LEVOTHROID) 75 MCG tablet Take 75 mcg by mouth every other day.    Yes [provider]  levothyroxine (SYNTHROID, LEVOTHROID) 88 MCG tablet Take 88 mcg by mouth every other day.    Yes [provider]  montelukast (SINGULAIR) 10 MG tablet Take 10 mg by mouth at bedtime.   Yes  [provider]  nebivolol (BYSTOLIC) 5 MG tablet Take 5 mg by mouth daily.   Yes [provider]  olmesartan-hydrochlorothiazide (BENICAR HCT) 40-12.5 MG tablet Take 1 tablet by mouth daily.   Yes [provider]  albuterol (PROVENTIL HFA;VENTOLIN HFA) 108 (90 Base) MCG/ACT inhaler Inhale 1-2 puffs into the lungs every 6 (six) hours as needed for wheezing or shortness of breath.    [provider]  ciprofloxacin (CIPRO) 500 MG tablet Take 500 mg by mouth 2 (two) times daily.    [provider]  hydroxypropyl methylcellulose / hypromellose (ISOPTO TEARS / GONIOVISC) 2.5 % ophthalmic solution Place 1 drop into both eyes 3 (three) times daily as needed for dry eyes.    [provider]  Propylene Glycol (SYSTANE BALANCE) 0.6 % SOLN Place 1-2 drops into both eyes daily.    [provider]  saccharomyces boulardii (FLORASTOR) 250 MG capsule Take 250 mg by mouth daily.     [provider]     Family History  Problem Relation Age of Onset  . Heart disease Maternal Grandmother   . Diabetes Maternal Grandmother   . Colon cancer Father   . Heart attack Mother   . Diabetes Mother   . Hypertension Brother   . Multiple sclerosis Sister   . Diabetes Sister   . Breast cancer Cousin     Social History   Social History  .  Marital status: Married    Spouse name: N/A  . Number of children: N/A  . Years of education: N/A   Social History Main Topics  . Smoking status: Never Smoker  . Smokeless tobacco: Never Used  . Alcohol use Yes  . Drug use: No  . Sexual activity: Yes    Partners: Male    Birth control/ protection: IUD     Comment: mirena   Other Topics Concern  . None   Social History Narrative  . None      Review of Systems currently denies fever, headache, chest pain, dyspnea, abdominal pain, nausea, vomiting or abnormal bleeding. She does have occasional cough and some intermittent low back pain.  Vital  Signs: Pulse 74   Temp 98.4 F (36.9 C) (Oral)   Resp 18   SpO2 100%   Physical Exam awake, alert. Chest clear to auscultation bilaterally. Heart with regular rate and rhythm. Abdomen obese, soft, positive bowel sounds, nontender. No lower extremity edema.  Mallampati Score:     Imaging: [place] Abdomen Limited Ruq  Result Date: 08/28/2016 CLINICAL DATA:  Acute onset of right upper quadrant abdominal pain. Initial encounter. EXAM: ULTRASOUND ABDOMEN LIMITED RIGHT UPPER QUADRANT COMPARISON:  None. FINDINGS: Gallbladder: No gallstones or wall thickening visualized. No sonographic Murphy sign noted by sonographer. Common bile duct: Diameter: 0.3 cm, within normal limits in caliber. Liver: No focal lesion identified. Within normal limits in parenchymal echogenicity. IMPRESSION: Unremarkable ultrasound of the right upper quadrant. Electronically Signed   By: Roanna RaiderJeffery  Chang M.D.   On: 08/28/2016 23:07    Labs:  CBC:  Recent Labs  01/05/16 2238 08/28/16 2055 08/29/16 0332  WBC 6.1 7.7 6.1  HGB 13.7 13.7 12.2  HCT 40.9 39.3 36.3  PLT 258 192 190    COAGS:  Recent Labs  01/05/16 2238 08/28/16 2055 08/29/16 0332 08/30/16 0505  INR 0.90 1.26 1.29 1.43    BMP:  Recent Labs  01/05/16 2238 08/28/16 2055 08/29/16 0332 08/30/16 0505  NA 140 137 141 140  K 3.8 3.0* 3.0* 3.7  CL 109 106 108 111  CO2 24 25 27 25   GLUCOSE 126* 118* 112* 86  BUN 9 5* 5* <5*  CALCIUM 9.6 8.9 8.1* 8.2*  CREATININE 0.99 0.86 0.74 0.77  GFRNONAA >60 >60 >60 >60  GFRAA >60 >60 >60 >60    LIVER FUNCTION TESTS:  Recent Labs  01/05/16 2238 08/28/16 2055 08/29/16 0332 08/30/16 0505  BILITOT 0.5 7.3* 6.2* 6.4*  AST 30 1,233* 1,108* 1,045*  ALT 18 1,287* 1,196* 1,033*  ALKPHOS 65 162* 151* 135*  PROT 7.2 6.4* 5.6* 5.1*  ALBUMIN 3.9 3.1* 2.7* 2.3*    TUMOR MARKERS: No results for input(s): AFPTM, CEA, CA199, CHROMGRNA in the last 8760 hours.  Assessment and Plan: 51 y.o. female with  history of elevated liver function tests as well as elevated anti smooth muscle antibody who presents today for image guided random core liver biopsy for further evaluation.Risks and benefits discussed with the patient/spouse including, but not limited to bleeding, infection, damage to adjacent structures or low yield requiring additional tests. All of the patient's questions were answered, patient is agreeable to proceed. Consent signed and in chart.Labs pending.       Thank you for this interesting consult.  I greatly enjoyed meeting Rachel [name] and look forward to participating in their care.  A copy of this report was sent to the requesting provider on this date.  Electronically Signed: D. Caryn BeeKevin  Joson Sapp, PA-C 09/07/2016, 11:56 AM   I spent a total of 25 minutes in face to face in clinical consultation, greater than 50% of which was counseling/coordinating care for image guided random core liver biopsy

## 2016-09-07 NOTE — Procedures (Signed)
Interventional Radiology Procedure Note  Procedure: US guided core biopsy of liver  Complications: None  Estimated Blood Loss: < 10 mL  18 G core biopsy in right lobe x 2 via 17 G needle.  Jodi MarbleGlenn T. Fredia SorrowYamagata, M.D Pager:  [phone]

## 2016-09-07 NOTE — Discharge Instructions (Addendum)
Moderate Conscious Sedation, Adult, Care After These instructions provide you with information about caring for yourself after your procedure. Your health care provider may also give you more specific instructions. Your treatment has been planned according to current medical practices, but problems sometimes occur. Call your health care provider if you have any problems or questions after your procedure. What can I expect after the procedure? After your procedure, it is common:  To feel sleepy for several hours.  To feel clumsy and have poor balance for several hours.  To have poor judgment for several hours.  To vomit if you eat too soon.  Follow these instructions at home: For at least 24 hours after the procedure:   Do not: ? Participate in activities where you could fall or become injured. ? Drive. ? Use heavy machinery. ? Drink alcohol. ? Take sleeping pills or medicines that cause drowsiness. ? Make important decisions or sign legal documents. ? Take care of children on your own.  Rest. Eating and drinking  Follow the diet recommended by your health care provider.  If you vomit: ? Drink water, juice, or soup when you can drink without vomiting. ? Make sure you have little or no nausea before eating solid foods. General instructions  Have a responsible adult stay with you until you are awake and alert.  Take over-the-counter and prescription medicines only as told by your health care provider.  If you smoke, do not smoke without supervision.  Keep all follow-up visits as told by your health care provider. This is important. Contact a health care provider if:  You keep feeling nauseous or you keep vomiting.  You feel light-headed.  You develop a rash.  You have a fever. Get help right away if:  You have trouble breathing. This information is not intended to replace advice given to you by your health care provider. Make sure you discuss any questions you have  with your health care provider. Document Released: 12/14/2012 Document Revised: 07/29/2015 Document Reviewed: 06/15/2015 Elsevier Interactive Patient Education  2018 [employer]   Liver Biopsy, Care After These instructions give you information on caring for yourself after your procedure. Your doctor may also give you more specific instructions. Call your doctor if you have any problems or questions after your procedure. Follow these instructions at home:  Rest at home for 1-2 days or as told by your doctor.  Have someone stay with you for at least 24 hours.  Do not do these things in the first 24 hours: ? Drive. ? Use machinery. ? Take care of other people. ? Sign legal documents. ? You may remove bandaid and take a bath or shower tomorrow 09/08/16.  There are many different ways to close and cover a cut (incision). For example, a cut can be closed with stitches, skin glue, or adhesive strips. Follow your doctor's instructions on: ? Taking care of your cut.  Check your biopsy site every day for signs of infection. Watch for: ? Redness, swelling, or pain. ? Fluid, blood, or pus. ? Remove dressing tomorrow 09/08/16  Do not drink alcohol in the first week.  Do not lift more than 5 pounds or play contact sports for the first 2 weeks.  Get your test results. Contact a doctor if:  A cut bleeds and leaves more than just a small spot of blood.  A cut is red, puffs up (swells), or hurts more than before.  Fluid or something else comes from a cut.  A  cut smells bad.  You have a fever or chills. Get help right away if:  You have swelling, bloating, or pain in your belly (abdomen).  You get dizzy or faint.  You have a rash.  You feel sick to your stomach (nauseous) or throw up (vomit).  You have trouble breathing, feel short of breath, or feel faint.  Your chest hurts.  You have problems talking or seeing.  You have trouble balancing or moving your arms or legs. This  information is not intended to replace advice given to you by your health care provider. Make sure you discuss any questions you have with your health care provider. Document Released: 12/03/2007 Document Revised: 08/01/2015 Document Reviewed: 04/21/2013 Elsevier Interactive Patient Education  [employer].

## 2016-10-02 ENCOUNTER — Encounter (HOSPITAL_COMMUNITY): Payer: Self-pay

## 2017-04-20 ENCOUNTER — Other Ambulatory Visit: Payer: Self-pay | Admitting: Obstetrics and Gynecology

## 2017-04-20 DIAGNOSIS — Z1231 Encounter for screening mammogram for malignant neoplasm of breast: Secondary | ICD-10-CM

## 2017-06-04 ENCOUNTER — Ambulatory Visit
Admission: RE | Admit: 2017-06-04 | Discharge: 2017-06-04 | Disposition: A | Discharge status: Home / Self Care | Payer: BLUE CROSS/BLUE SHIELD | Source: Ambulatory Visit | Attending: Obstetrics and Gynecology | Admitting: Obstetrics and Gynecology

## 2017-06-04 DIAGNOSIS — Z1231 Encounter for screening mammogram for malignant neoplasm of breast: Secondary | ICD-10-CM

## 2017-06-07 ENCOUNTER — Encounter (HOSPITAL_COMMUNITY): Payer: Self-pay | Admitting: *Deleted

## 2017-06-07 ENCOUNTER — Other Ambulatory Visit: Payer: Self-pay

## 2017-06-07 ENCOUNTER — Emergency Department (HOSPITAL_COMMUNITY): Payer: BLUE CROSS/BLUE SHIELD

## 2017-06-07 ENCOUNTER — Observation Stay (HOSPITAL_BASED_OUTPATIENT_CLINIC_OR_DEPARTMENT_OTHER): Payer: BLUE CROSS/BLUE SHIELD

## 2017-06-07 ENCOUNTER — Observation Stay (HOSPITAL_COMMUNITY)
Admission: EM | Admit: 2017-06-07 | Discharge: 2017-06-08 | Disposition: A | Discharge status: Home / Self Care | Payer: BLUE CROSS/BLUE SHIELD | Attending: Internal Medicine | Admitting: Internal Medicine

## 2017-06-07 DIAGNOSIS — I4891 Unspecified atrial fibrillation: Secondary | ICD-10-CM | POA: Diagnosis present

## 2017-06-07 DIAGNOSIS — I10 Essential (primary) hypertension: Secondary | ICD-10-CM | POA: Diagnosis not present

## 2017-06-07 DIAGNOSIS — R002 Palpitations: Secondary | ICD-10-CM | POA: Diagnosis present

## 2017-06-07 DIAGNOSIS — E039 Hypothyroidism, unspecified: Secondary | ICD-10-CM | POA: Diagnosis not present

## 2017-06-07 DIAGNOSIS — K754 Autoimmune hepatitis: Secondary | ICD-10-CM | POA: Diagnosis not present

## 2017-06-07 DIAGNOSIS — Z79899 Other long term (current) drug therapy: Secondary | ICD-10-CM | POA: Insufficient documentation

## 2017-06-07 DIAGNOSIS — I480 Paroxysmal atrial fibrillation: Secondary | ICD-10-CM | POA: Diagnosis not present

## 2017-06-07 HISTORY — DX: Autoimmune hepatitis: K75.4

## 2017-06-07 LAB — I-STAT CHEM 8, ED
BUN: 9 mg/dL (ref 6–20)
CALCIUM ION: 1.21 mmol/L (ref 1.15–1.40)
Chloride: 107 mmol/L (ref 101–111)
Creatinine, Ser: 0.8 mg/dL (ref 0.44–1.00)
GLUCOSE: 133 mg/dL — AB (ref 65–99)
HCT: 47 % — ABNORMAL HIGH (ref 36.0–46.0)
HEMOGLOBIN: 16 g/dL — AB (ref 12.0–15.0)
POTASSIUM: 3.2 mmol/L — AB (ref 3.5–5.1)
Sodium: 145 mmol/L (ref 135–145)
TCO2: 25 mmol/L (ref 22–32)

## 2017-06-07 LAB — I-STAT BETA HCG BLOOD, ED (MC, WL, AP ONLY): I-stat hCG, quantitative: 5.5 m[IU]/mL — ABNORMAL HIGH (ref <5)

## 2017-06-07 LAB — CBC
HEMATOCRIT: 44.3 % (ref 36.0–46.0)
Hemoglobin: 14.8 g/dL (ref 12.0–15.0)
MCH: 29.4 pg (ref 26.0–34.0)
MCHC: 33.4 g/dL (ref 30.0–36.0)
MCV: 88.1 fL (ref 78.0–100.0)
PLATELETS: 193 10*3/uL (ref 150–400)
RBC: 5.03 MIL/uL (ref 3.87–5.11)
RDW: 13.5 % (ref 11.5–15.5)
WBC: 4.4 10*3/uL (ref 4.0–10.5)

## 2017-06-07 LAB — ECHOCARDIOGRAM COMPLETE
HEIGHTINCHES: 67 in
Weight: 3696 oz

## 2017-06-07 LAB — MAGNESIUM: Magnesium: 1.8 mg/dL (ref 1.7–2.4)

## 2017-06-07 LAB — HEPATIC FUNCTION PANEL
ALK PHOS: 66 U/L (ref 38–126)
ALT: 28 U/L (ref 14–54)
AST: 36 U/L (ref 15–41)
Albumin: 3.5 g/dL (ref 3.5–5.0)
BILIRUBIN INDIRECT: 0.8 mg/dL (ref 0.3–0.9)
BILIRUBIN TOTAL: 0.9 mg/dL (ref 0.3–1.2)
Bilirubin, Direct: 0.1 mg/dL (ref 0.1–0.5)
Total Protein: 7.3 g/dL (ref 6.5–8.1)

## 2017-06-07 LAB — TROPONIN I

## 2017-06-07 LAB — I-STAT TROPONIN, ED: Troponin i, poc: 0 ng/mL (ref 0.00–0.08)

## 2017-06-07 LAB — TSH: TSH: 3.833 u[IU]/mL (ref 0.350–4.500)

## 2017-06-07 MED ORDER — APIXABAN 5 MG PO TABS
5.0000 mg | ORAL_TABLET | Freq: Two times a day (BID) | ORAL | Status: DC
  Administered 2017-06-07 – 2017-06-08 (×3): 5 mg via ORAL
  Filled 2017-06-07 (×4): qty 1

## 2017-06-07 MED ORDER — HEPARIN (PORCINE) IN NACL 100-0.45 UNIT/ML-% IJ SOLN
1200.0000 [IU]/h | INTRAMUSCULAR | Status: DC
  Administered 2017-06-07: 1200 [IU]/h via INTRAVENOUS
  Filled 2017-06-07: qty 250

## 2017-06-07 MED ORDER — DILTIAZEM HCL-DEXTROSE 100-5 MG/100ML-% IV SOLN (PREMIX)
5.0000 mg/h | INTRAVENOUS | Status: DC
  Administered 2017-06-07: 5 mg/h via INTRAVENOUS
  Filled 2017-06-07 (×2): qty 100

## 2017-06-07 MED ORDER — FLECAINIDE ACETATE 50 MG PO TABS
50.0000 mg | ORAL_TABLET | Freq: Two times a day (BID) | ORAL | Status: DC
  Administered 2017-06-07 – 2017-06-08 (×3): 50 mg via ORAL
  Filled 2017-06-07 (×3): qty 1

## 2017-06-07 MED ORDER — POTASSIUM CHLORIDE CRYS ER 20 MEQ PO TBCR
40.0000 meq | EXTENDED_RELEASE_TABLET | ORAL | Status: AC
  Administered 2017-06-07 (×2): 40 meq via ORAL
  Filled 2017-06-07 (×2): qty 2

## 2017-06-07 MED ORDER — OLMESARTAN MEDOXOMIL-HCTZ 40-12.5 MG PO TABS: 1.0000 | ORAL_TABLET | Freq: Every day | ORAL | Status: DC

## 2017-06-07 MED ORDER — OMEGA-3-ACID ETHYL ESTERS 1 G PO CAPS
2.0000 g | ORAL_CAPSULE | Freq: Every day | ORAL | Status: DC
  Administered 2017-06-07 – 2017-06-08 (×2): 2 g via ORAL
  Filled 2017-06-07 (×3): qty 2

## 2017-06-07 MED ORDER — LEVOTHYROXINE SODIUM 50 MCG PO TABS
50.0000 ug | ORAL_TABLET | ORAL | Status: DC
  Administered 2017-06-07: 50 ug via ORAL
  Filled 2017-06-07: qty 1

## 2017-06-07 MED ORDER — LEVOTHYROXINE SODIUM 75 MCG PO TABS
75.0000 ug | ORAL_TABLET | ORAL | Status: DC
  Administered 2017-06-08: 75 ug via ORAL
  Filled 2017-06-07: qty 1

## 2017-06-07 MED ORDER — IRBESARTAN 150 MG PO TABS
300.0000 mg | ORAL_TABLET | Freq: Every day | ORAL | Status: DC
  Administered 2017-06-07 – 2017-06-08 (×2): 300 mg via ORAL
  Filled 2017-06-07 (×3): qty 1
  Filled 2017-06-07: qty 2

## 2017-06-07 MED ORDER — MERCAPTOPURINE 50 MG PO TABS
75.0000 mg | ORAL_TABLET | Freq: Every day | ORAL | Status: DC
  Administered 2017-06-07 – 2017-06-08 (×2): 75 mg via ORAL
  Filled 2017-06-07 (×2): qty 2

## 2017-06-07 MED ORDER — DILTIAZEM LOAD VIA INFUSION
10.0000 mg | Freq: Once | INTRAVENOUS | Status: AC
  Administered 2017-06-07: 10 mg via INTRAVENOUS
  Filled 2017-06-07: qty 10

## 2017-06-07 MED ORDER — MONTELUKAST SODIUM 10 MG PO TABS
10.0000 mg | ORAL_TABLET | Freq: Every day | ORAL | Status: DC
  Administered 2017-06-07: 10 mg via ORAL
  Filled 2017-06-07 (×2): qty 1

## 2017-06-07 MED ORDER — CYCLOSPORINE 0.05 % OP EMUL
1.0000 [drp] | Freq: Two times a day (BID) | OPHTHALMIC | Status: DC | PRN
  Filled 2017-06-07: qty 1

## 2017-06-07 MED ORDER — HEPARIN BOLUS VIA INFUSION
4000.0000 [IU] | Freq: Once | INTRAVENOUS | Status: AC
  Administered 2017-06-07: 4000 [IU] via INTRAVENOUS
  Filled 2017-06-07: qty 4000

## 2017-06-07 MED ORDER — MAGNESIUM SULFATE 2 GM/50ML IV SOLN
2.0000 g | Freq: Once | INTRAVENOUS | Status: AC
  Administered 2017-06-07: 2 g via INTRAVENOUS
  Filled 2017-06-07: qty 50

## 2017-06-07 MED ORDER — ONDANSETRON HCL 4 MG PO TABS: 4.0000 mg | ORAL_TABLET | Freq: Four times a day (QID) | ORAL | Status: DC | PRN

## 2017-06-07 MED ORDER — ONDANSETRON HCL 4 MG/2ML IJ SOLN: 4.0000 mg | Freq: Four times a day (QID) | INTRAMUSCULAR | Status: DC | PRN

## 2017-06-07 MED ORDER — HYDROCHLOROTHIAZIDE 12.5 MG PO CAPS
12.5000 mg | ORAL_CAPSULE | Freq: Every day | ORAL | Status: DC
  Administered 2017-06-07 – 2017-06-08 (×2): 12.5 mg via ORAL
  Filled 2017-06-07 (×2): qty 1

## 2017-06-07 MED ORDER — METOPROLOL TARTRATE 25 MG PO TABS
25.0000 mg | ORAL_TABLET | Freq: Two times a day (BID) | ORAL | Status: DC
  Administered 2017-06-07 – 2017-06-08 (×3): 25 mg via ORAL
  Filled 2017-06-07 (×3): qty 1

## 2017-06-07 MED ORDER — NEBIVOLOL HCL 10 MG PO TABS
10.0000 mg | ORAL_TABLET | Freq: Every day | ORAL | Status: DC
  Filled 2017-06-07: qty 1

## 2017-06-07 NOTE — Consult Note (Signed)
Cardiology Consultation:   Patient ID: Rachel [name]; [id number]; 1965/07/13   Admit date: 06/07/2017 Date of Consult: 06/07/2017  Primary Care Provider: Coralee Rud, PA-C Primary Cardiologist: Arnette Felts, [facility]  Patient Profile:   Rachel [name] is a 52 y.o. female with a hx of Hypertension, PAF, gastroparesis, autoimmune hepatitis 08/2016 who is being seen today for the evaluation of atrial fibrillation at the request of Dr. Isidoro Donning.  History of Present Illness:   Rachel [name] had an episode of atrial fibrillation in 12/2015. She was cardioverted in the ED and started on metoprolol. She followed up with Arnette Felts at [facility] and had a normal echo and stress test per pt report. She was taken off anticoagulation. She has had no known recurrences until last night. After she laid down to bed she felt like her heart was out of rhythm, slowing down and speeding up. She had no chest discomfort, dyspnea, lightheadedness, near syncope or other symptoms. She feels that this is the first recurrence of afib. She did note mild upper abdominal tightness last evening after dinner, that she relates to her gastroparesis.   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 She has no orthopnea, PND or edema.   Past Medical History:  Diagnosis Date  . Abnormal pap   . Abnormal Pap smear   . Autoimmune hepatitis (HCC)   . Gastroparesis   . Hypertension   . Thyroid disease    "nodule"    Past Surgical History:  Procedure Laterality Date  . COLONOSCOPY    . HERNIA REPAIR    . JOINT REPLACEMENT    . KNEE SURGERY    . MYOMECTOMY    . THROAT SURGERY       Home Medications:  Prior to Admission medications   Medication Sig Start Date End Date Taking? Authorizing Provider  albuterol (PROVENTIL HFA;VENTOLIN HFA) 108 (90 Base) MCG/ACT inhaler Inhale 1-2 puffs into the lungs every 6 (six) hours as needed for wheezing or shortness of breath.    Yes [provider]  CALCIUM-MAGNESIUM-ZINC PO Take 1 tablet by mouth daily.   Yes [provider]  cholecalciferol (VITAMIN D) 1000 UNITS tablet Take 1,000 Units by mouth daily.   Yes [provider]  cycloSPORINE (RESTASIS) 0.05 % ophthalmic emulsion Place 1 drop into both eyes 2 (two) times daily as needed (dryness).    Yes [provider]  fish oil-omega-3 fatty acids 1000 MG capsule Take 2 g by mouth daily.   Yes [provider]  levothyroxine (SYNTHROID, LEVOTHROID) 50 MCG tablet Take 50 mcg by mouth every other day.   Yes [provider]  levothyroxine (SYNTHROID, LEVOTHROID) 75 MCG tablet Take 75 mcg by mouth every other day.    Yes [provider]  mercaptopurine (PURINETHOL) 50 MG tablet Take 75 mg by mouth daily. Give on an empty stomach 1 hour before or 2 hours after meals. Caution: Chemotherapy.   Yes [provider]  montelukast (SINGULAIR) 10 MG tablet Take 10 mg by mouth at bedtime.   Yes [provider]  nebivolol (BYSTOLIC) 5 MG tablet Take 10 mg by mouth daily.    Yes [provider]  olmesartan-hydrochlorothiazide (BENICAR HCT) 40-12.5 MG tablet Take 1 tablet by mouth daily.   Yes [provider]  Probiotic Product (DAILY PROBIOTIC PO) Take 1 tablet by mouth daily.   Yes [provider]    Inpatient  Medications: Scheduled Meds: . irbesartan  300 mg Oral Daily   And  . hydrochlorothiazide  12.5 mg Oral Daily  . levothyroxine  50 mcg Oral QODAY  . [START ON 06/08/2017] levothyroxine  75 mcg Oral QODAY  . mercaptopurine  75 mg Oral Daily  . montelukast  10 mg Oral QHS  . nebivolol  10 mg Oral Daily  . omega-3 acid ethyl esters  2 g Oral Daily   Continuous Infusions: . diltiazem (CARDIZEM) infusion 7.5 mg/hr (06/07/17 0438)  . heparin 1,200 Units/hr (06/07/17 0510)   PRN Meds: cycloSPORINE, ondansetron **OR** ondansetron (ZOFRAN) IV  Allergies:    Allergies    Allergen Reactions  . Oxycodone-Acetaminophen Nausea And Vomiting    Social History:   Social History   Socioeconomic History  . Marital status: Married    Spouse name: Not on file  . Number of children: Not on file  . Years of education: Not on file  . Highest education level: Not on file  Occupational History  . Not on file  Social Needs  . Financial resource strain: Not on file  . Food insecurity:    Worry: Not on file    Inability: Not on file  . Transportation needs:    Medical: Not on file    Non-medical: Not on file  Tobacco Use  . Smoking status: Never Smoker  . Smokeless tobacco: Never Used  Substance and Sexual Activity  . Alcohol use: Yes  . Drug use: No  . Sexual activity: Yes    Partners: Male    Birth control/protection: IUD    Comment: mirena  Lifestyle  . Physical activity:    Days per week: Not on file    Minutes per session: Not on file  . Stress: Not on file  Relationships  . Social connections:    Talks on phone: Not on file    Gets together: Not on file    Attends religious service: Not on file    Active member of club or organization: Not on file    Attends meetings of clubs or organizations: Not on file    Relationship status: Not on file  . Intimate partner violence:    Fear of current or ex partner: Not on file    Emotionally abused: Not on file    Physically abused: Not on file    Forced sexual activity: Not on file  Other Topics Concern  . Not on file  Social History Narrative  . Not on file    Family History:    Family History  Problem Relation Age of Onset  . Heart disease Maternal Grandmother   . Diabetes Maternal Grandmother   . Colon cancer Father   . Heart attack Mother        CAD, stents in her 72's  . Diabetes Mother   . Hypertension Brother   . Multiple sclerosis Sister   . Diabetes Sister   . Hypertension Sister   . Breast cancer Cousin   . Hypertension Brother      ROS:  Please see the history of  present illness.   All other ROS reviewed and negative.     Physical Exam/Data:   Vitals:   06/07/17 0730 06/07/17 0745 06/07/17 0900 06/07/17 0940  BP: 130/86  113/77 (!) 131/97  Pulse: (!) 107 98 69 84  Resp: 16 19 14 16   Temp:      SpO2: 99% 98% 99% 98%  Weight:  Height:       No intake or output data in the 24 hours ending 06/07/17 1033 Filed Weights   06/07/17 0233  Weight: 231 lb (104.8 kg)   Body mass index is 36.18 kg/m.  General:  Well nourished, well developed, in no acute distress HEENT: normal Lymph: no adenopathy Neck: no JVD Endocrine:  No thryomegaly Vascular: No carotid bruits; FA pulses 2+ bilaterally without bruits  Cardiac:  normal S1, S2; irregularly irregular; no murmur  Lungs:  clear to auscultation bilaterally, no wheezing, rhonchi or rales  Abd: soft, nontender, no hepatomegaly  Ext: no edema Musculoskeletal:  No deformities, BUE and BLE strength normal and equal Skin: warm and dry  Neuro:  CNs 2-12 intact, no focal abnormalities noted Psych:  Normal affect   EKG:  The EKG was personally reviewed and demonstrates:  Atrial fibrillation, 154 bpm Telemetry:  Telemetry was personally reviewed and demonstrates:  Atrial fibrillation 80's-low 100's  Relevant CV Studies:  Echo pending  Laboratory Data:  Chemistry Recent Labs  Lab 06/07/17 0247  NA 145  K 3.2*  CL 107  GLUCOSE 133*  BUN 9  CREATININE 0.80    No results for input(s): PROT, ALBUMIN, AST, ALT, ALKPHOS, BILITOT in the last 168 hours. Hematology Recent Labs  Lab 06/07/17 0238 06/07/17 0247  WBC 4.4  --   RBC 5.03  --   HGB 14.8 16.0*  HCT 44.3 47.0*  MCV 88.1  --   MCH 29.4  --   MCHC 33.4  --   RDW 13.5  --   PLT 193  --    Cardiac Enzymes Recent Labs  Lab 06/07/17 0510  TROPONINI <0.03    Recent Labs  Lab 06/07/17 0245  TROPIPOC 0.00    BNPNo results for input(s): BNP, PROBNP in the last 168 hours.  DDimer No results for input(s): DDIMER in the last  168 hours.  Radiology/Studies:  Dg Chest Portable 1 View  Result Date: 06/07/2017 CLINICAL DATA:  52 year old female with AFib. EXAM: PORTABLE CHEST 1 VIEW COMPARISON:  Chest radiograph dated 01/05/2016 FINDINGS: The heart size and mediastinal contours are within normal limits. Both lungs are clear. The visualized skeletal structures are unremarkable. IMPRESSION: No active disease. Electronically Signed   By: Elgie Collard M.D.   On: 06/07/2017 03:11   Mm Screening Breast Tomo Bilateral  Result Date: 06/04/2017 CLINICAL DATA:  Screening. EXAM: DIGITAL SCREENING BILATERAL MAMMOGRAM WITH TOMO AND CAD COMPARISON:  Previous exam(s). ACR Breast Density Category b: There are scattered areas of fibroglandular density. FINDINGS: There are no findings suspicious for malignancy. Images were processed with CAD. IMPRESSION: No mammographic evidence of malignancy. A result letter of this screening mammogram will be mailed directly to the patient. RECOMMENDATION: Screening mammogram in one year. (Code:SM-B-01Y) BI-RADS CATEGORY  1: Negative. Electronically Signed   By: Norva Pavlov M.D.   On: 06/04/2017 15:56    Assessment and Plan:   Recurrent afib -Last episode in 12/2015, cardioverted. Follow up normal echo and stress test.  -Developed palpitations around midnight last night. Pt feels strongly that this is the first reoccurrence. No other associated symptoms.  -Not previously anticoagulated. CHA2DS2/VAS Stroke Risk Score is 2 (HTN, female). Currently on IV heparin. Will start Eliquis 5 mg BID for stroke risk reduction.  -Discussed with Dr. Rennis Golden. Will start antiarrhythmic therapy to prevent recurrences. Flecainide 50 mg BID. Will change bystolic to metoprolol 25 mg bid for rate control. Wean off cardizem once rate controlled on new meds.  -  Since this just occurred overnight, will plan for cardioversion after 3 doses of Eliquis, tomorrow afternoon, if pt remains in afib.   Hypertension -At home she  is on Bystolic 10 mg daily and olmesartan-HCTZ 40-12.5 mg daily -Will switch Bystolic to metoprolol for better heart rate control. -Monitor BP while adjusting meds.   Thyroid disease -TSH 3.833. Takes levothyroxine. Pt reports thyroid nodule.    Hypokalemia -K+ 3.2. Will replace Replace with goal to keep >=4. Mag is 1.8. Will replace.    Autoimmune hepatitis -Treated with mercaptopurine  For questions or updates, please contact [facility] Please consult [email or web address] for contact info under Cardiology/STEMI.   Signed, Berton BonJanine Loren Sawaya, NP  06/07/2017 10:33 AM

## 2017-06-07 NOTE — ED Provider Notes (Signed)
[facility] EMERGENCY DEPARTMENT Provider Note   CSN: [id number] Arrival date & time: 06/07/17  0217     History   Chief Complaint Chief Complaint  Patient presents with  . Tachycardia    HPI Rachel [name] is a 52 y.o. female.  The history is provided by the patient.  Palpitations   This is a recurrent problem. The current episode started 1 to 2 hours ago. The problem occurs constantly. The problem has not changed since onset.The problem is associated with an unknown factor. Associated symptoms include irregular heartbeat. Pertinent negatives include no diaphoresis, no fever, no numbness, no claudication, no abdominal pain, no nausea, no vomiting, no headaches, no back pain, no leg pain, no lower extremity edema, no shortness of breath and no sputum production.    Past Medical History:  Diagnosis Date  . Abnormal pap   . Abnormal Pap smear   . Gastroparesis   . Hypertension   . Thyroid disease    "nodule"    Patient Active Problem List   Diagnosis Date Noted  . Transaminitis   . Acute hepatitis 08/29/2016  . Jaundice 08/29/2016  . Hypokalemia 08/29/2016  . Hypothyroidism 08/29/2016  . Hypertension 08/29/2016  . Acute lower UTI 08/29/2016  . Abnormal pap   . GASTROPARESIS 07/26/2008  . OBESITY 05/12/2007  . GERD 11/03/2005    Past Surgical History:  Procedure Laterality Date  . COLONOSCOPY    . HERNIA REPAIR    . JOINT REPLACEMENT    . KNEE SURGERY    . MYOMECTOMY    . THROAT SURGERY       OB History    Gravida  2   Para  2   Term      Preterm      AB      Living  0     SAB      TAB      Ectopic      Multiple      Live Births               Home Medications    Prior to Admission medications   Medication Sig Start Date End Date Taking? Authorizing Provider  albuterol (PROVENTIL HFA;VENTOLIN HFA) 108 (90 Base) MCG/ACT inhaler Inhale 1-2 puffs into the lungs every 6 (six) hours as needed for wheezing or shortness  of breath.    [provider]  Budesonide (UCERIS) 9 MG TB24 Take 1 tablet by mouth daily.    [provider]  CALCIUM-MAGNESIUM-ZINC PO Take 1 tablet by mouth daily.    [provider]  cholecalciferol (VITAMIN D) 1000 UNITS tablet Take 1,000 Units by mouth daily.    [provider]  cycloSPORINE (RESTASIS) 0.05 % ophthalmic emulsion Place 1 drop into both eyes 2 (two) times daily as needed (dryness).     [provider]  erythromycin base (E-MYCIN) 500 MG tablet Take 500 mg by mouth 2 (two) times daily.    [provider]  fish oil-omega-3 fatty acids 1000 MG capsule Take 2 g by mouth daily.    [provider]  levothyroxine (SYNTHROID, LEVOTHROID) 75 MCG tablet Take 75 mcg by mouth every other day.     [provider]  levothyroxine (SYNTHROID, LEVOTHROID) 88 MCG tablet Take 88 mcg by mouth every other day.     [provider]  montelukast (SINGULAIR) 10 MG tablet Take 10 mg by mouth at bedtime.    [provider]  nebivolol (BYSTOLIC) 5 MG tablet Take 5 mg by mouth daily.    [provider]  olmesartan-hydrochlorothiazide (BENICAR HCT) 40-12.5 MG tablet Take 1 tablet by mouth daily.    [provider]    Family History Family History  Problem Relation Age of Onset  . Heart disease Maternal Grandmother   . Diabetes Maternal Grandmother   . Colon cancer Father   . Heart attack Mother   . Diabetes Mother   . Hypertension Brother   . Multiple sclerosis Sister   . Diabetes Sister   . Breast cancer Cousin     Social History Social History   Tobacco Use  . Smoking status: Never Smoker  . Smokeless tobacco: Never Used  Substance Use Topics  . Alcohol use: Yes  . Drug use: No     Allergies   Oxycodone-acetaminophen   Review of Systems Review of Systems  Constitutional: Negative for diaphoresis and fever.  Respiratory: Negative for sputum production and shortness of  breath.   Cardiovascular: Positive for palpitations. Negative for claudication and leg swelling.  Gastrointestinal: Negative for abdominal pain, nausea and vomiting.  Musculoskeletal: Negative for back pain.  Neurological: Negative for numbness and headaches.  All other systems reviewed and are negative.    Physical Exam Updated Vital Signs BP (!) 141/85   Pulse 94   Temp 98.7 F (37.1 C)   Resp 19   Ht 5\' 7"  (1.702 m)   Wt 104.8 kg (231 lb)   SpO2 98%   BMI 36.18 kg/m   Physical Exam  Constitutional: She is oriented to person, place, and time. She appears well-developed and well-nourished. No distress.  HENT:  Head: Normocephalic and atraumatic.  Nose: Nose normal.  Mouth/Throat: No oropharyngeal exudate.  Eyes: Pupils are equal, round, and reactive to light. Conjunctivae are normal.  Neck: Normal range of motion.  Cardiovascular: An irregularly irregular rhythm present. Tachycardia present.  Pulmonary/Chest: Effort normal and breath sounds normal. No stridor. She has no wheezes. She has no rales.  Abdominal: Soft. Bowel sounds are normal. She exhibits no mass. There is no tenderness. There is no rebound and no guarding.  Musculoskeletal: Normal range of motion. She exhibits no edema or tenderness.  Neurological: She is alert and oriented to person, place, and time. She displays normal reflexes.  Skin: Skin is warm and dry. Capillary refill takes less than 2 seconds.  Psychiatric: She has a normal mood and affect.  Nursing note and vitals reviewed.    ED Treatments / Results  Labs (all labs ordered are listed, but only abnormal results are displayed) Results for orders placed or performed during the hospital encounter of 06/07/17  CBC  Result Value Ref Range   WBC 4.4 4.0 - 10.5 K/uL   RBC 5.03 3.87 - 5.11 MIL/uL   Hemoglobin 14.8 12.0 - 15.0 g/dL   HCT 40.944.3 81.136.0 - 91.446.0 %   MCV 88.1 78.0 - 100.0 fL   MCH 29.4 26.0 - 34.0 pg   MCHC 33.4 30.0 - 36.0 g/dL   RDW  78.213.5 95.611.5 - 21.315.5 %   Platelets 193 150 - 400 K/uL  I-Stat Chem 8, ED  Result Value Ref Range   Sodium 145 135 - 145 mmol/L   Potassium 3.2 (L) 3.5 - 5.1 mmol/L   Chloride 107 101 - 111 mmol/L   BUN 9 6 - 20 mg/dL   Creatinine, Ser 0.860.80 0.44 - 1.00 mg/dL   Glucose, Bld 578133 (H) 65 - 99 mg/dL  Calcium, Ion 1.21 1.15 - 1.40 mmol/L   TCO2 25 22 - 32 mmol/L   Hemoglobin 16.0 (H) 12.0 - 15.0 g/dL   HCT 16.1 (H) 09.6 - 04.5 %  I-stat troponin, ED  Result Value Ref Range   Troponin i, poc 0.00 0.00 - 0.08 ng/mL   Comment 3          I-Stat Beta hCG blood, ED ([facility], [facility], [facility] only)  Result Value Ref Range   I-stat hCG, quantitative 5.5 (H) <5 mIU/mL   Comment 3           Dg Chest Portable 1 View  Result Date: 06/07/2017 CLINICAL DATA:  52 year old female with AFib. EXAM: PORTABLE CHEST 1 VIEW COMPARISON:  Chest radiograph dated 01/05/2016 FINDINGS: The heart size and mediastinal contours are within normal limits. Both lungs are clear. The visualized skeletal structures are unremarkable. IMPRESSION: No active disease. Electronically Signed   By: Elgie Collard M.D.   On: 06/07/2017 03:11   Mm Screening Breast Tomo Bilateral  Result Date: 06/04/2017 CLINICAL DATA:  Screening. EXAM: DIGITAL SCREENING BILATERAL MAMMOGRAM WITH TOMO AND CAD COMPARISON:  Previous exam(s). ACR Breast Density Category b: There are scattered areas of fibroglandular density. FINDINGS: There are no findings suspicious for malignancy. Images were processed with CAD. IMPRESSION: No mammographic evidence of malignancy. A result letter of this screening mammogram will be mailed directly to the patient. RECOMMENDATION: Screening mammogram in one year. (Code:SM-B-01Y) BI-RADS CATEGORY  1: Negative. Electronically Signed   By: Norva Pavlov M.D.   On: 06/04/2017 15:56    EKG EKG Interpretation  Date/Time:  Monday Carmen Tolliver 01 2019 02:28:05 EDT Ventricular Rate:  154 PR Interval:    QRS Duration: 76 QT Interval:  266 QTC  Calculation: 426 R Axis:   67 Text Interpretation:  Atrial fibrillation with rapid ventricular response with premature ventricular or aberrantly conducted complexes Confirmed by Nicanor Alcon, Iman Reinertsen ([id number]) on 06/07/2017 3:04:50 AM   Radiology Dg Chest Portable 1 View  Result Date: 06/07/2017 CLINICAL DATA:  52 year old female with AFib. EXAM: PORTABLE CHEST 1 VIEW COMPARISON:  Chest radiograph dated 01/05/2016 FINDINGS: The heart size and mediastinal contours are within normal limits. Both lungs are clear. The visualized skeletal structures are unremarkable. IMPRESSION: No active disease. Electronically Signed   By: Elgie Collard M.D.   On: 06/07/2017 03:11    Procedures Procedures (including critical care time)  Medications Ordered in ED Medications  diltiazem (CARDIZEM) 1 mg/mL load via infusion 10 mg (10 mg Intravenous Bolus from Bag 06/07/17 0314)    And  diltiazem (CARDIZEM) 100 mg in dextrose 5% (1 mg/mL) infusion (5 mg/hr Intravenous New Bag/Given 06/07/17 0314)     MDM Reviewed: previous chart, nursing note and vitals Interpretation: labs, x-ray and ECG (troponin negative NACPD by me on cxr) Consults: admitting MD   CRITICAL CARE Performed by: Jasmine Awe Total critical care time: 30 minutes Critical care time was exclusive of separately billable procedures and treating other patients. Critical care was necessary to treat or prevent imminent or life-threatening deterioration. Critical care was time spent personally by me on the following activities: development of treatment plan with patient and/or surrogate as well as nursing, discussions with consultants, evaluation of patient's response to treatment, examination of patient, obtaining history from patient or surrogate, ordering and performing treatments and interventions, ordering and review of laboratory studies, ordering and review of radiographic studies, pulse oximetry and re-evaluation of patient's  condition.   Final Clinical Impressions(s) / ED Diagnoses  Final diagnoses:  Atrial fibrillation with RVR (HCC)    Admit to medicine    Koren Sermersheim, MD 06/07/17 0330

## 2017-06-07 NOTE — ED Triage Notes (Signed)
The pt woke up with a fast irregular heart beat aroound 0030am  Hs of af  A year ago  On no meds for this  No chest pain  lmp none

## 2017-06-07 NOTE — ED Notes (Signed)
Cardiology continue to be at bedside.

## 2017-06-07 NOTE — ED Notes (Signed)
Requested meds from pharmacy  

## 2017-06-07 NOTE — Progress Notes (Signed)
ANTICOAGULATION CONSULT NOTE - Initial Consult  Pharmacy Consult for heparin > Eliquis Indication: atrial fibrillation  Allergies  Allergen Reactions  . Oxycodone-Acetaminophen Nausea And Vomiting    Patient Measurements: Height: 5\' 7"  (170.2 cm) Weight: 231 lb (104.8 kg) IBW/kg (Calculated) : 61.6  Vital Signs: Temp: 98.7 F (37.1 C) (04/01 0235) BP: 128/95 (04/01 1015) Pulse Rate: 69 (04/01 1030)  Labs: Recent Labs    06/07/17 0238 06/07/17 0247 06/07/17 0510  HGB 14.8 16.0*  --   HCT 44.3 47.0*  --   PLT 193  --   --   CREATININE  --  0.80  --   TROPONINI  --   --  <0.03    Estimated Creatinine Clearance: 101.3 mL/min (by C-G formula based on SCr of 0.8 mg/dL).  Assessment: [age] YOF who had Afib in 12/2015- was cardioverted and was taken off of anticoagulation at some point with no known recurrence until yesterday. She was started on IV heparin in the ED- now asked by cardiology to transition to Eliquis. Patient weighs ~105kg and SCr is 0.8, so she does not meet any reduction requirements.  Baseline CBC wnl, no bleeding noted. No major drug interactions noted with patients PTA med list.  Goal of Therapy:  proper dosing based on drug parameters Monitor platelets by anticoagulation protocol: Yes   Plan:  Stop heparin infusion Start Eliquis 5mg  PO BID Follow CBC and s/s bleeding Plans per cards for cardioversion after 3 doses of Eliquis- planned for tomorrow afternoon  Jesselyn Rask D. Preston Weill, PharmD, BCPS Clinical Pharmacist Clinical Phone for 06/07/2017 until 3:30pm: [phone] If after 3:30pm, please call main pharmacy at [phone] 06/07/2017 11:14 AM

## 2017-06-07 NOTE — ED Notes (Signed)
Report was given. Will transport pt up

## 2017-06-07 NOTE — Progress Notes (Signed)
ANTICOAGULATION CONSULT NOTE - Initial Consult  Pharmacy Consult for Heparin  Indication: atrial fibrillation  Allergies  Allergen Reactions  . Oxycodone-Acetaminophen Nausea And Vomiting   Patient Measurements: Height: 5\' 7"  (170.2 cm) Weight: 231 lb (104.8 kg) IBW/kg (Calculated) : 61.6  Vital Signs: Temp: 98.7 F (37.1 C) (04/01 0235) BP: 123/90 (04/01 0330) Pulse Rate: 78 (04/01 0330)  Labs: Recent Labs    06/07/17 0238 06/07/17 0247  HGB 14.8 16.0*  HCT 44.3 47.0*  PLT 193  --   CREATININE  --  0.80    Estimated Creatinine Clearance: 101.3 mL/min (by C-G formula based on SCr of 0.8 mg/dL).   Medical History: Past Medical History:  Diagnosis Date  . Abnormal pap   . Abnormal Pap smear   . Gastroparesis   . Hypertension   . Thyroid disease    "nodule"    Assessment: 52 y/o F to begin heparin for atrial fibrillation. CBC/renal function good. No anti-coagulation PTA.   Goal of Therapy:  Heparin level 0.3-0.7 units/ml Monitor platelets by anticoagulation protocol: Yes   Plan:  Heparin 4000 units BOLUS Start heparin drip at 1200 units/hr 1300 HL Daily CBC/HL Monitor for bleeding   Abran DukeLedford, [place] Karl 06/07/2017,4:38 AM

## 2017-06-07 NOTE — ED Notes (Signed)
Regular dinner tray ordered 

## 2017-06-07 NOTE — ED Notes (Signed)
Cardiology at bedside.

## 2017-06-07 NOTE — Progress Notes (Signed)
[facility]                                                                              Patient Demographics  Rachel [name], is a 51 y.o. female, DOB - [birth year], ZOX:[id number]  Admit date - 06/07/2017   Admitting Physician No admitting provider for patient encounter.  Outpatient Primary MD for the patient is Coralee Rud, PA-C  Outpatient specialists:   LOS - 0  days   Medical records reviewed and are as summarized below:    Chief Complaint  Patient presents with  . Tachycardia       Brief summary   Patient is a 52 year old female with a history of hypertension, hypothyroidism, autoimmune hepatitis, first episode atrial fibrillation in 2017, was cardioverted presented with palpitations that started on the night of admission 12 midnight when patient was going to the bathroom.  Otherwise denied any chest pain, shortness of breath, dizziness.  Patient was placed on IV Cardizem infusion, heparin drip.   Assessment & Plan    Principal Problem: Paroxysmal atrial fibrillation with RVR (HCC) -First episode in October 2017 when patient was cardioverted - Not anticoagulated, currently on IV heparin drip, cardiology recommended Eliquis -Cardiology was consulted, plan for cardioversion after 3 doses of Eliquis, likely on 4/2 patient still in A. Fib.  Recommended antiarrhythmic flecainide 50mg  BID and lopressor   Active Problems:   Hypothyroidism -Continue Synthroid, TSH 3.8     Essential hypertension -BP currently stable, cardiology recommended switching Bystolic to metoprolol  Continue olmesartan- HCTZ     Autoimmune hepatitis (HCC) -Treated with mercaptopurine  Hypokalemia -Replace   Code Status: Full code DVT Prophylaxis: Eliquis Family Communication: Discussed in detail with the patient, all imaging results, lab results explained to the patient and husband at bedside   Disposition Plan:   Time Spent in minutes 25 minutes  Procedures:     Consultants:   cardiology  Antimicrobials:      Medications  Scheduled Meds: . apixaban  5 mg Oral BID  . flecainide  50 mg Oral Q12H  . irbesartan  300 mg Oral Daily   And  . hydrochlorothiazide  12.5 mg Oral Daily  . levothyroxine  50 mcg Oral QODAY  . [START ON 06/08/2017] levothyroxine  75 mcg Oral QODAY  . mercaptopurine  75 mg Oral Daily  . metoprolol tartrate  25 mg Oral BID  . montelukast  10 mg Oral QHS  . omega-3 acid ethyl esters  2 g Oral Daily  . potassium chloride  40 mEq Oral Q1 Hr x 2   Continuous Infusions: . diltiazem (CARDIZEM) infusion 7.5 mg/hr (06/07/17 0438)  . magnesium sulfate 1 - 4 g bolus IVPB     PRN Meds:.cycloSPORINE, ondansetron **OR** ondansetron (ZOFRAN) IV   Antibiotics   Anti-infectives (From admission, onward)   None        Subjective:   Rachel [name] was seen and examined today.  Feeling better, patient denies dizziness, chest pain, shortness of breath, abdominal pain, N/V/D/C, new weakness, numbess, tingling.   Objective:   Vitals:   06/07/17 0945 06/07/17 1000 06/07/17 1015 06/07/17 1030  BP: Marland Kitchen)  125/94 134/90 (!) 128/95   Pulse: 81 (!) 101 83 69  Resp: 14 19 16  (!) 22  Temp:      SpO2: 100% 100% 100% 100%  Weight:      Height:       No intake or output data in the 24 hours ending 06/07/17 1150   Wt Readings from Last 3 Encounters:  06/07/17 104.8 kg (231 lb)  08/29/16 104.8 kg (231 lb)  04/07/12 109.3 kg (241 lb)     Exam  General: Alert and oriented x 3, NAD  Eyes:  HEENT:  Atraumatic, normocephalic  Cardiovascular: S1 S2 auscultated, irregularly irregular.  Respiratory: Clear to auscultation bilaterally, no wheezing, rales or rhonchi  Gastrointestinal: Soft, nontender, nondistended, + bowel sounds  Ext: no pedal edema bilaterally  Neuro: no new deficits  Musculoskeletal: No digital cyanosis, clubbing  Skin: No rashes  Psych: Normal affect and demeanor, alert and oriented x3    Data  Reviewed:  I have personally reviewed following labs and imaging studies  Micro Results No results found for this or any previous visit (from the past 240 hour(s)).  Radiology Reports Dg Chest Portable 1 View  Result Date: 06/07/2017 CLINICAL DATA:  52 year old female with AFib. EXAM: PORTABLE CHEST 1 VIEW COMPARISON:  Chest radiograph dated 01/05/2016 FINDINGS: The heart size and mediastinal contours are within normal limits. Both lungs are clear. The visualized skeletal structures are unremarkable. IMPRESSION: No active disease. Electronically Signed   By: Elgie Collard M.D.   On: 06/07/2017 03:11   Mm Screening Breast Tomo Bilateral  Result Date: 06/04/2017 CLINICAL DATA:  Screening. EXAM: DIGITAL SCREENING BILATERAL MAMMOGRAM WITH TOMO AND CAD COMPARISON:  Previous exam(s). ACR Breast Density Category b: There are scattered areas of fibroglandular density. FINDINGS: There are no findings suspicious for malignancy. Images were processed with CAD. IMPRESSION: No mammographic evidence of malignancy. A result letter of this screening mammogram will be mailed directly to the patient. RECOMMENDATION: Screening mammogram in one year. (Code:SM-B-01Y) BI-RADS CATEGORY  1: Negative. Electronically Signed   By: Norva Pavlov M.D.   On: 06/04/2017 15:56    Lab Data:  CBC: Recent Labs  Lab 06/07/17 0238 06/07/17 0247  WBC 4.4  --   HGB 14.8 16.0*  HCT 44.3 47.0*  MCV 88.1  --   PLT 193  --    Basic Metabolic Panel: Recent Labs  Lab 06/07/17 0247 06/07/17 0510  NA 145  --   K 3.2*  --   CL 107  --   GLUCOSE 133*  --   BUN 9  --   CREATININE 0.80  --   MG  --  1.8   GFR: Estimated Creatinine Clearance: 101.3 mL/min (by C-G formula based on SCr of 0.8 mg/dL). Liver Function Tests: No results for input(s): AST, ALT, ALKPHOS, BILITOT, PROT, ALBUMIN in the last 168 hours. No results for input(s): LIPASE, AMYLASE in the last 168 hours. No results for input(s): AMMONIA in the last  168 hours. Coagulation Profile: No results for input(s): INR, PROTIME in the last 168 hours. Cardiac Enzymes: Recent Labs  Lab 06/07/17 0510  TROPONINI <0.03   BNP (last 3 results) No results for input(s): PROBNP in the last 8760 hours. HbA1C: No results for input(s): HGBA1C in the last 72 hours. CBG: No results for input(s): GLUCAP in the last 168 hours. Lipid Profile: No results for input(s): CHOL, HDL, LDLCALC, TRIG, CHOLHDL, LDLDIRECT in the last 72 hours. Thyroid Function Tests: Recent Labs  06/07/17 0246  TSH 3.833   Anemia Panel: No results for input(s): VITAMINB12, FOLATE, FERRITIN, TIBC, IRON, RETICCTPCT in the last 72 hours. Urine analysis:    Component Value Date/Time   COLORURINE AMBER (A) 08/29/2016 0419   APPEARANCEUR CLEAR 08/29/2016 0419   LABSPEC 1.010 08/29/2016 0419   PHURINE 6.0 08/29/2016 0419   GLUCOSEU NEGATIVE 08/29/2016 0419   HGBUR NEGATIVE 08/29/2016 0419   BILIRUBINUR SMALL (A) 08/29/2016 0419   KETONESUR NEGATIVE 08/29/2016 0419   PROTEINUR NEGATIVE 08/29/2016 0419   UROBILINOGEN 0.2 09/14/2014 1847   NITRITE NEGATIVE 08/29/2016 0419   LEUKOCYTESUR NEGATIVE 08/29/2016 0419     Hanford Lust M.D. [facility] 06/07/2017, 11:50 AM  Pager: [phone] Between 7am to 7pm - call Pager - [phone]  After 7pm go to [email or web address] - password [email or web address]  Call night coverage person covering after 7pm

## 2017-06-07 NOTE — ED Notes (Signed)
Call to 6E 

## 2017-06-07 NOTE — H&P (Signed)
History and Physical    Rachel Harthelma P [place] ZOX:[id number] DOB: 11-20-1965 DOA: 06/07/2017  PCP: Coralee Ruduran, Michael R, PA-C  Patient coming from: Home.  Chief Complaint: Palpitations.  HPI: Rachel [name] is a 52 y.o. female with history of hypertension, hypothyroidism, autoimmune hepatitis and previous history of atrial fibrillation in 2017 October when patient was cardioverted presents to the ER with complaints of palpitations.  Patient's palpitations started tonight around 12 midnight when patient was going to the bathroom.  Denies any chest pain shortness of breath dizziness.  Denies any fever or chills.  ED Course: In the ER patient was found to be in A. fib with RVR was given Cardizem bolus followed by Cardizem infusion.  On my exam patient is still in A. fib with RVR.  Patient admitted for further observation.  Review of Systems: As per HPI, rest all negative.   Past Medical History:  Diagnosis Date  . Abnormal pap   . Abnormal Pap smear   . Gastroparesis   . Hypertension   . Thyroid disease    "nodule"    Past Surgical History:  Procedure Laterality Date  . COLONOSCOPY    . HERNIA REPAIR    . JOINT REPLACEMENT    . KNEE SURGERY    . MYOMECTOMY    . THROAT SURGERY       reports that she has never smoked. She has never used smokeless tobacco. She reports that she drinks alcohol. She reports that she does not use drugs.  Allergies  Allergen Reactions  . Oxycodone-Acetaminophen Nausea And Vomiting    Family History  Problem Relation Age of Onset  . Heart disease Maternal Grandmother   . Diabetes Maternal Grandmother   . Colon cancer Father   . Heart attack Mother   . Diabetes Mother   . Hypertension Brother   . Multiple sclerosis Sister   . Diabetes Sister   . Breast cancer Cousin     Prior to Admission medications   Medication Sig Start Date End Date Taking? Authorizing Provider  albuterol (PROVENTIL HFA;VENTOLIN HFA) 108 (90 Base) MCG/ACT inhaler Inhale 1-2 puffs  into the lungs every 6 (six) hours as needed for wheezing or shortness of breath.   Yes [provider]  CALCIUM-MAGNESIUM-ZINC PO Take 1 tablet by mouth daily.   Yes [provider]  cholecalciferol (VITAMIN D) 1000 UNITS tablet Take 1,000 Units by mouth daily.   Yes [provider]  cycloSPORINE (RESTASIS) 0.05 % ophthalmic emulsion Place 1 drop into both eyes 2 (two) times daily as needed (dryness).    Yes [provider]  fish oil-omega-3 fatty acids 1000 MG capsule Take 2 g by mouth daily.   Yes [provider]  levothyroxine (SYNTHROID, LEVOTHROID) 50 MCG tablet Take 50 mcg by mouth every other day.   Yes [provider]  levothyroxine (SYNTHROID, LEVOTHROID) 75 MCG tablet Take 75 mcg by mouth every other day.    Yes [provider]  mercaptopurine (PURINETHOL) 50 MG tablet Take 75 mg by mouth daily. Give on an empty stomach 1 hour before or 2 hours after meals. Caution: Chemotherapy.   Yes [provider]  montelukast (SINGULAIR) 10 MG tablet Take 10 mg by mouth at bedtime.   Yes [provider]  nebivolol (BYSTOLIC) 5 MG tablet Take 10 mg by mouth daily.    Yes [provider]  olmesartan-hydrochlorothiazide (BENICAR HCT) 40-12.5 MG tablet Take 1 tablet by mouth daily.   Yes [provider]  Probiotic Product (DAILY PROBIOTIC PO) Take 1 tablet by mouth daily.   Yes [provider]    Physical Exam: Vitals:   06/07/17 0235 06/07/17 0300 06/07/17 0319 06/07/17 0330  BP: (!) 146/82 131/90 (!) 141/85 123/90  Pulse: (!) 154 (!) 59 94 78  Resp: 20 17 19 15   Temp: 98.7 F (37.1 C)     SpO2: 99% 99% 98% 99%  Weight:      Height:          Constitutional: Moderately built and nourished. Vitals:   06/07/17 0235 06/07/17 0300 06/07/17 0319 06/07/17 0330  BP: (!) 146/82 131/90 (!) 141/85 123/90  Pulse: (!) 154 (!) 59 94 78  Resp: 20 17 19 15   Temp: 98.7 F (37.1 C)     SpO2: 99%  99% 98% 99%  Weight:      Height:       Eyes: Anicteric no pallor. ENMT: No discharge from the ears eyes nose or mouth. Neck: No mass felt.  No neck rigidity. Respiratory: No rhonchi or crepitations. Cardiovascular: S1-S2 heard no murmurs appreciated. Abdomen: Soft nontender bowel sounds present. Musculoskeletal: No edema.  No joint effusion. Skin: No rash.  Skin appears warm. Neurologic: Alert awake oriented to time place and person.  Moves all extremities. Psychiatric: Appears normal.  Normal affect.   Labs on Admission: I have personally reviewed following labs and imaging studies  CBC: Recent Labs  Lab 06/07/17 0238 06/07/17 0247  WBC 4.4  --   HGB 14.8 16.0*  HCT 44.3 47.0*  MCV 88.1  --   PLT 193  --    Basic Metabolic Panel: Recent Labs  Lab 06/07/17 0247  NA 145  K 3.2*  CL 107  GLUCOSE 133*  BUN 9  CREATININE 0.80   GFR: Estimated Creatinine Clearance: 101.3 mL/min (by C-G formula based on SCr of 0.8 mg/dL). Liver Function Tests: No results for input(s): AST, ALT, ALKPHOS, BILITOT, PROT, ALBUMIN in the last 168 hours. No results for input(s): LIPASE, AMYLASE in the last 168 hours. No results for input(s): AMMONIA in the last 168 hours. Coagulation Profile: No results for input(s): INR, PROTIME in the last 168 hours. Cardiac Enzymes: No results for input(s): CKTOTAL, CKMB, CKMBINDEX, TROPONINI in the last 168 hours. BNP (last 3 results) No results for input(s): PROBNP in the last 8760 hours. HbA1C: No results for input(s): HGBA1C in the last 72 hours. CBG: No results for input(s): GLUCAP in the last 168 hours. Lipid Profile: No results for input(s): CHOL, HDL, LDLCALC, TRIG, CHOLHDL, LDLDIRECT in the last 72 hours. Thyroid Function Tests: No results for input(s): TSH, T4TOTAL, FREET4, T3FREE, THYROIDAB in the last 72 hours. Anemia Panel: No results for input(s): VITAMINB12, FOLATE, FERRITIN, TIBC, IRON, RETICCTPCT in the last 72 hours. Urine  analysis:    Component Value Date/Time   COLORURINE AMBER (A) 08/29/2016 0419   APPEARANCEUR CLEAR 08/29/2016 0419   LABSPEC 1.010 08/29/2016 0419   PHURINE 6.0 08/29/2016 0419   GLUCOSEU NEGATIVE 08/29/2016 0419   HGBUR NEGATIVE 08/29/2016 0419   BILIRUBINUR SMALL (A) 08/29/2016 0419   KETONESUR NEGATIVE 08/29/2016 0419   PROTEINUR NEGATIVE 08/29/2016 0419   UROBILINOGEN 0.2 09/14/2014 1847   NITRITE NEGATIVE 08/29/2016 0419   LEUKOCYTESUR NEGATIVE 08/29/2016 0419   Sepsis Labs: @LABRCNTIP (procalcitonin:4,lacticidven:4) )No results found for this or any previous visit (from the past 240 hour(s)).   Radiological Exams on Admission: Dg Chest Portable 1 View  Result Date: 06/07/2017 CLINICAL DATA:  52 year old female with  AFib. EXAM: PORTABLE CHEST 1 VIEW COMPARISON:  Chest radiograph dated 01/05/2016 FINDINGS: The heart size and mediastinal contours are within normal limits. Both lungs are clear. The visualized skeletal structures are unremarkable. IMPRESSION: No active disease. Electronically Signed   By: Elgie Collard M.D.   On: 06/07/2017 03:11    EKG: Independently reviewed.  A. fib with RVR.  Assessment/Plan Principal Problem:   Atrial fibrillation with RVR (HCC) Active Problems:   Hypothyroidism   Essential hypertension   Autoimmune hepatitis (HCC)    1. A. fib with RVR -patient has been placed on Cardizem infusion.  Patient takes Bystolic following which I will try to wean off Cardizem.  Check cardiac markers and TSH.  2D echo.  Patient's chads 2 vasc score is 2.  Patient is started on heparin.  Will consult cardiology. 2. Hypertension on ARB and hydrochlorothiazide.  Presently on Cardizem infusion. 3. Autoimmune hepatitis on mercaptopurine. 4. Hypothyroidism on Synthroid.  Check TSH. 5. History of asthma.   DVT prophylaxis: Heparin. Code Status: Full code. Family Communication: Discussed with patient. Disposition Plan: Home. Consults called:  Cardiology. Admission status: Observation.   Eduard Clos MD [facility] Pager [phone].  If 7PM-7AM, please contact night-coverage [email or web address] Password [email or web address]  06/07/2017, 4:59 AM

## 2017-06-07 NOTE — ED Notes (Signed)
No diet ordered, pt is NPO

## 2017-06-08 ENCOUNTER — Encounter (HOSPITAL_COMMUNITY)
Admission: EM | Disposition: A | Discharge status: Home / Self Care | Payer: Self-pay | Source: Home / Self Care | Attending: Emergency Medicine

## 2017-06-08 ENCOUNTER — Other Ambulatory Visit: Payer: Self-pay

## 2017-06-08 DIAGNOSIS — I4891 Unspecified atrial fibrillation: Secondary | ICD-10-CM | POA: Diagnosis not present

## 2017-06-08 DIAGNOSIS — I10 Essential (primary) hypertension: Secondary | ICD-10-CM | POA: Diagnosis not present

## 2017-06-08 DIAGNOSIS — K754 Autoimmune hepatitis: Secondary | ICD-10-CM | POA: Diagnosis not present

## 2017-06-08 DIAGNOSIS — I480 Paroxysmal atrial fibrillation: Secondary | ICD-10-CM | POA: Diagnosis not present

## 2017-06-08 DIAGNOSIS — E039 Hypothyroidism, unspecified: Secondary | ICD-10-CM | POA: Diagnosis not present

## 2017-06-08 LAB — BASIC METABOLIC PANEL
ANION GAP: 8 (ref 5–15)
BUN: 13 mg/dL (ref 6–20)
CHLORIDE: 105 mmol/L (ref 101–111)
CO2: 23 mmol/L (ref 22–32)
Calcium: 9.5 mg/dL (ref 8.9–10.3)
Creatinine, Ser: 0.79 mg/dL (ref 0.44–1.00)
GFR calc Af Amer: >60 mL/min (ref >60)
GLUCOSE: 106 mg/dL — AB (ref 65–99)
POTASSIUM: 4 mmol/L (ref 3.5–5.1)
Sodium: 136 mmol/L (ref 135–145)

## 2017-06-08 LAB — MRSA PCR SCREENING: MRSA BY PCR: NEGATIVE

## 2017-06-08 SURGERY — CARDIOVERSION
Anesthesia: General

## 2017-06-08 MED ORDER — APIXABAN 5 MG PO TABS: 5.0000 mg | ORAL_TABLET | Freq: Two times a day (BID) | ORAL | 3 refills | Status: AC

## 2017-06-08 MED ORDER — METOPROLOL TARTRATE 25 MG PO TABS: 25.0000 mg | ORAL_TABLET | Freq: Two times a day (BID) | ORAL | 3 refills | Status: AC

## 2017-06-08 MED ORDER — PNEUMOCOCCAL VAC POLYVALENT 25 MCG/0.5ML IJ INJ: 0.5000 mL | INJECTION | INTRAMUSCULAR | Status: DC

## 2017-06-08 MED ORDER — FLECAINIDE ACETATE 50 MG PO TABS: 50.0000 mg | ORAL_TABLET | Freq: Two times a day (BID) | ORAL | 3 refills | Status: DC

## 2017-06-08 NOTE — Care Management Note (Addendum)
Case Management Note  Patient Details  Name: Rachel [name] MRN: [id number] Date of Birth: 1965-07-08  Subjective/Objective:  Pt resented for Atrial Fib with RVR- Pt is now on Eliquis 5 mg tablet twice daily. Benefits Check in process- pt aware that she does not have to wait for co pay and was provided 30 day free/ co pay card. Pt will need a Rx for 30 day free.  [facility] [place].              Action/Plan: No further needs from CM @ this time.   Expected Discharge Date:  06/08/17               Expected Discharge Plan:  Home/Self Care  In-House Referral:  NA  Discharge planning Services  CM Consult, Medication Assistance  Post Acute Care Choice:  NA Choice offered to:  NA  DME Arranged:  N/A DME Agency:  NA  HH Arranged:  NA HH Agency:  NA  Status of Service:  Completed, signed off  If discussed at Long Length of Stay Meetings, dates discussed:    Additional Comments:  Gala LewandowskyGraves-Bigelow, Lake[place]ra Reliford Kaye, RN 06/08/2017, 10:53 AM

## 2017-06-08 NOTE — Progress Notes (Signed)
DAILY PROGRESS NOTE   Patient Name: [name] Date of Encounter: 06/08/2017  Chief Complaint   No complaints  Patient Profile   Rachel [name] is a 52 y.o. female with a hx of Hypertension, PAF, gastroparesis, autoimmune hepatitis 08/2016 who is being seen today for the evaluation of atrial fibrillation at the request of Dr. Tana Coast.  Subjective   Converted to NSR overnight. Started on flecainide - EKG today pending. Wants to go home. BP normal today. Echo yesterday shows LVEF 60-65%, moderate LVH, normal wall motion, normal LA Size, trivial posterior pericardial effusion.  Objective   Vitals:   06/07/17 2226 06/07/17 2228 06/08/17 0653 06/08/17 0835  BP: 129/89  117/87 (!) 125/91  Pulse:  66  68  Resp:   16   Temp:   98 F (36.7 C)   TempSrc:   Oral   SpO2:   100%   Weight:      Height:        Intake/Output Summary (Last 24 hours) at 06/08/2017 0940 Last data filed at 06/07/2017 2226 Gross per 24 hour  Intake 15 ml  Output -  Net 15 ml   Filed Weights   06/07/17 0233  Weight: 231 lb (104.8 kg)    Physical Exam   General appearance: alert and no distress Neck: no carotid bruit, no JVD and thyroid not enlarged, symmetric, no tenderness/mass/nodules Lungs: clear to auscultation bilaterally Heart: regular rate and rhythm Abdomen: soft, non-tender; bowel sounds normal; no masses,  no organomegaly Extremities: extremities normal, atraumatic, no cyanosis or edema Pulses: 2+ and symmetric Skin: Skin color, texture, turgor normal. No rashes or lesions Neurologic: Grossly normal Psych: Pleasant  Inpatient Medications    Scheduled Meds: . apixaban  5 mg Oral BID  . flecainide  50 mg Oral Q12H  . irbesartan  300 mg Oral Daily   And  . hydrochlorothiazide  12.5 mg Oral Daily  . levothyroxine  50 mcg Oral QODAY  . levothyroxine  75 mcg Oral QODAY  . mercaptopurine  75 mg Oral Daily  . metoprolol tartrate  25 mg Oral BID  . montelukast  10 mg Oral QHS  . omega-3  acid ethyl esters  2 g Oral Daily  . [START ON 06/09/2017] pneumococcal 23 valent vaccine  0.5 mL Intramuscular Tomorrow-1000    Continuous Infusions: . diltiazem (CARDIZEM) infusion Stopped (06/07/17 2226)    PRN Meds: cycloSPORINE, ondansetron **OR** ondansetron (ZOFRAN) IV   Labs   Results for orders placed or performed during the hospital encounter of 06/07/17 (from the past 48 hour(s))  CBC     Status: None   Collection Time: 06/07/17  2:38 AM  Result Value Ref Range   WBC 4.4 4.0 - 10.5 K/uL   RBC 5.03 3.87 - 5.11 MIL/uL   Hemoglobin 14.8 12.0 - 15.0 g/dL   HCT 44.3 36.0 - 46.0 %   MCV 88.1 78.0 - 100.0 fL   MCH 29.4 26.0 - 34.0 pg   MCHC 33.4 30.0 - 36.0 g/dL   RDW 13.5 11.5 - 15.5 %   Platelets 193 150 - 400 K/uL    Comment: Performed at [facility], [place] [address] [place], [place] [address]  I-stat troponin, ED     Status: None   Collection Time: 06/07/17  2:45 AM  Result Value Ref Range   Troponin i, poc 0.00 0.00 - 0.08 ng/mL   Comment 3  Comment: Due to the release kinetics of cTnI, a negative result within the first hours of the onset of symptoms does not rule out myocardial infarction with certainty. If myocardial infarction is still suspected, repeat the test at appropriate intervals.   I-Stat Beta hCG blood, ED ([facility], [facility], [facility] only)     Status: Abnormal   Collection Time: 06/07/17  2:45 AM  Result Value Ref Range   I-stat hCG, quantitative 5.5 (H) <5 mIU/mL   Comment 3            Comment:   GEST. AGE      CONC.  (mIU/mL)   <=1 WEEK        5 - 50     2 WEEKS       50 - 500     3 WEEKS       100 - 10,000     4 WEEKS     1,000 - 30,000        FEMALE AND NON-PREGNANT FEMALE:     LESS THAN 5 mIU/mL   TSH     Status: None   Collection Time: 06/07/17  2:46 AM  Result Value Ref Range   TSH 3.833 0.350 - 4.500 uIU/mL    Comment: Performed by a 3rd Generation assay with a functional sensitivity of <=0.01 uIU/mL. Performed at [facility], [place] [address] [place], [place] [address]   I-Stat Chem 8, ED     Status: Abnormal   Collection Time: 06/07/17  2:47 AM  Result Value Ref Range   Sodium 145 135 - 145 mmol/L   Potassium 3.2 (L) 3.5 - 5.1 mmol/L   Chloride 107 101 - 111 mmol/L   BUN 9 6 - 20 mg/dL   Creatinine, Ser 0.80 0.44 - 1.00 mg/dL   Glucose, Bld 133 (H) 65 - 99 mg/dL   Calcium, Ion 1.21 1.15 - 1.40 mmol/L   TCO2 25 22 - 32 mmol/L   Hemoglobin 16.0 (H) 12.0 - 15.0 g/dL   HCT 47.0 (H) 36.0 - 46.0 %  Magnesium     Status: None   Collection Time: 06/07/17  5:10 AM  Result Value Ref Range   Magnesium 1.8 1.7 - 2.4 mg/dL    Comment: Performed at [facility], [place] [address], [place], [place] [address]  Troponin I     Status: None   Collection Time: 06/07/17  5:10 AM  Result Value Ref Range   Troponin I <0.03 <0.03 ng/mL    Comment: Performed at [facility] [address], [place], [place] [address]  Troponin I     Status: None   Collection Time: 06/07/17 10:58 AM  Result Value Ref Range   Troponin I <0.03 <0.03 ng/mL    Comment: Performed at [facility] [address] [place], [place] [address]  Hepatic function panel     Status: None   Collection Time: 06/07/17 10:58 AM  Result Value Ref Range   Total Protein 7.3 6.5 - 8.1 g/dL   Albumin 3.5 3.5 - 5.0 g/dL   AST 36 15 - 41 U/L   ALT 28 14 - 54 U/L   Alkaline Phosphatase 66 38 - 126 U/L   Total Bilirubin 0.9 0.3 - 1.2 mg/dL   Bilirubin, Direct 0.1 0.1 - 0.5 mg/dL   Indirect Bilirubin 0.8 0.3 - 0.9 mg/dL    Comment: Performed at [facility] [address] [place], [place]  [address]  Troponin I     Status: None   Collection Time: 06/07/17  6:28 PM  Result Value Ref Range   Troponin I <0.03 <0.03 ng/mL    Comment: Performed at [facility], [place] [address], [place], [place] [address]  MRSA PCR Screening     Status: None   Collection Time: 06/07/17  7:33 PM  Result Value Ref Range   MRSA by PCR NEGATIVE  NEGATIVE    Comment:        The GeneXpert MRSA Assay (FDA approved for NASAL specimens only), is one component of a comprehensive MRSA colonization surveillance program. It is not intended to diagnose MRSA infection nor to guide or monitor treatment for MRSA infections. Performed at [facility], [place] [address], [place], [place] [address]   Basic metabolic panel     Status: Abnormal   Collection Time: 06/08/17  7:45 AM  Result Value Ref Range   Sodium 136 135 - 145 mmol/L   Potassium 4.0 3.5 - 5.1 mmol/L   Chloride 105 101 - 111 mmol/L   CO2 23 22 - 32 mmol/L   Glucose, Bld 106 (H) 65 - 99 mg/dL   BUN 13 6 - 20 mg/dL   Creatinine, Ser 0.79 0.44 - 1.00 mg/dL   Calcium 9.5 8.9 - 10.3 mg/dL   GFR calc non Af Amer >60 >60 mL/min   GFR calc Af Amer >60 >60 mL/min    Comment: (NOTE) The eGFR has been calculated using the CKD EPI equation. This calculation has not been validated in all clinical situations. eGFR's persistently <60 mL/min signify possible Chronic Kidney Disease.    Anion gap 8 5 - 15    Comment: Performed at [facility] [address] [place], [place] [address]    ECG   Pending - Personally Reviewed  Telemetry   NSR - Personally Reviewed  Radiology    Dg Chest Portable 1 View  Result Date: 06/07/2017 CLINICAL DATA:  52 year old female with AFib. EXAM: PORTABLE CHEST 1 VIEW COMPARISON:  Chest radiograph dated 01/05/2016 FINDINGS: The heart size and mediastinal contours are within normal limits. Both lungs are clear. The visualized skeletal structures are unremarkable. IMPRESSION: No active disease. Electronically Signed   By: Anner Crete M.D.   On: 06/07/2017 03:11    Cardiac Studies   LV EF: 60% -   65%  ------------------------------------------------------------------- Indications:      Atrial fibrillation - 427.31.  ------------------------------------------------------------------- History:   PMH:   Atrial fibrillation.   Atrial fibrillation.  Risk factors:  Hypertension. Dyslipidemia.  ------------------------------------------------------------------- Study Conclusions  - Left ventricle: The cavity size was normal. Wall thickness was   increased in a pattern of moderate LVH. Systolic function was   normal. The estimated ejection fraction was in the range of 60%   to 65%. Wall motion was normal; there were no regional wall   motion abnormalities. - Pericardium, extracardiac: A trivial pericardial effusion was   identified.  Assessment   1. Principal Problem: 2.   Atrial fibrillation with RVR ([place]) 3. Active Problems: 4.   Hypothyroidism 5.   Essential hypertension 6.   Autoimmune hepatitis ([place]) 7.   Plan   1. Converted to NSR overnight - started on flecainide. Changed Bystolic to metoprolol. Eliquis started. Will need follow-up with Roque Cash, PA-C at [facility] center - he is her PCP and provides cardiac care with Dr. Claudie Leach. Will need a repeat EKG at follow-up to monitor QTc -  I ordered an EKG today to check QTc and demonstrate sinus rhythm. [place] for d/c home today from our standpoint.  Time Spent Directly with Patient:  I have spent a total of 15 minutes with the patient reviewing hospital notes, telemetry, EKGs, labs and examining the patient as well as establishing an assessment and plan that was discussed personally with the patient. > 50% of time was spent in direct patient care.  Length of Stay:  LOS: 0 days   Pixie Casino, MD, [facility], [place] Director of the Advanced Lipid Disorders &  Cardiovascular Risk Reduction Clinic Diplomate of the American Board of Clinical Lipidology Attending Cardiologist  Direct Dial: [phone]  Fax: [phone]  Website:  [email or web address] 06/08/2017, 9:40 AM

## 2017-06-08 NOTE — Discharge Summary (Signed)
Physician Discharge Summary   Patient ID: Rachel [name] MRN: [id number] DOB/AGE: 54-27-1967 54 y.o.  Admit date: 06/07/2017 Discharge date: 06/08/2017  Primary Care Physician:  Coralee Rud, PA-C   Recommendations for Outpatient Follow-up:  1. Follow up with PCP in 1-2 weeks  Home Health: None  Equipment/Devices: none   Discharge Condition: stable  CODE STATUS: FULL  Diet recommendation: Heart healthy diet   Discharge Diagnoses:    . Paroxysmal atrial fibrillation with RVR (HCC) . Hypothyroidism . Autoimmune hepatitis (HCC) Hypokalemia Essential hypertension  Consults: Cardiology    Allergies:   Allergies  Allergen Reactions  . Oxycodone-Acetaminophen Nausea And Vomiting     DISCHARGE MEDICATIONS: Allergies as of 06/08/2017      Reactions   Oxycodone-acetaminophen Nausea And Vomiting      Medication List    STOP taking these medications   nebivolol 5 MG tablet Commonly known as:  BYSTOLIC     TAKE these medications   albuterol 108 (90 Base) MCG/ACT inhaler Commonly known as:  PROVENTIL HFA;VENTOLIN HFA Inhale 1-2 puffs into the lungs every 6 (six) hours as needed for wheezing or shortness of breath.   apixaban 5 MG Tabs tablet Commonly known as:  ELIQUIS Take 1 tablet (5 mg total) by mouth 2 (two) times daily.   CALCIUM-MAGNESIUM-ZINC PO Take 1 tablet by mouth daily.   cholecalciferol 1000 units tablet Commonly known as:  VITAMIN D Take 1,000 Units by mouth daily.   cycloSPORINE 0.05 % ophthalmic emulsion Commonly known as:  RESTASIS Place 1 drop into both eyes 2 (two) times daily as needed (dryness).   DAILY PROBIOTIC PO Take 1 tablet by mouth daily.   fish oil-omega-3 fatty acids 1000 MG capsule Take 2 g by mouth daily.   flecainide 50 MG tablet Commonly known as:  TAMBOCOR Take 1 tablet (50 mg total) by mouth 2 (two) times daily.   levothyroxine 75 MCG tablet Commonly known as:  SYNTHROID, LEVOTHROID Take 75 mcg by mouth every  other day.   levothyroxine 50 MCG tablet Commonly known as:  SYNTHROID, LEVOTHROID Take 50 mcg by mouth every other day.   mercaptopurine 50 MG tablet Commonly known as:  PURINETHOL Take 75 mg by mouth daily. Give on an empty stomach 1 hour before or 2 hours after meals. Caution: Chemotherapy.   metoprolol tartrate 25 MG tablet Commonly known as:  LOPRESSOR Take 1 tablet (25 mg total) by mouth 2 (two) times daily.   montelukast 10 MG tablet Commonly known as:  SINGULAIR Take 10 mg by mouth at bedtime.   olmesartan-hydrochlorothiazide 40-12.5 MG tablet Commonly known as:  BENICAR HCT Take 1 tablet by mouth daily.        Brief H and P: For complete details please refer to admission H and P, but in brief Patient is a 52 year old female with a history of hypertension, hypothyroidism, autoimmune hepatitis, first episode atrial fibrillation in 2017, was cardioverted presented with palpitations that started on the night of admission 12 midnight when patient was going to the bathroom.  Otherwise denied any chest pain, shortness of breath, dizziness.  Patient was placed on IV Cardizem infusion, heparin drip.    Hospital Course:  Paroxysmal atrial fibrillation with RVR (HCC) -First episode in October 2017 when patient was cardioverted, patient was not on anticoagulation at the time of admission -Cardiology was consulted, started on flecainide and changed to Bystolic to metoprolol. -Patient was placed on Eliquis. -Initially there was a plan for cardioversion however patient spontaneously converted  to normal sinus rhythm, confirmed with repeat EKG on 06/08/17 -2D echo showed EF of 60-65%, no wall motion abnormalities, cleared by cardiology to be discharged home     Hypothyroidism -Continue Synthroid, TSH 3.8     Essential hypertension -BP currently stable, cardiology recommended switching Bystolic to metoprolol  Continue olmesartan- HCTZ     Autoimmune hepatitis (HCC) -Treated  with mercaptopurine  Hypokalemia Potassium was replaced, 4.0 at the time of discharge    Day of Discharge S: No complaints, feeling better  BP (!) 125/91 (BP Location: Left Arm)   Pulse 68   Temp 98 F (36.7 C) (Oral)   Resp 16   Ht 5\' 7"  (1.702 m)   Wt 104.8 kg (231 lb)   SpO2 100%   BMI 36.18 kg/m   Physical Exam: General: Alert and awake oriented x3 not in any acute distress. HEENT: anicteric sclera, pupils reactive to light and accommodation CVS: S1-S2 clear, regular Chest: clear to auscultation bilaterally, no wheezing rales or rhonchi Abdomen: soft nontender, nondistended, normal bowel sounds Extremities: no cyanosis, clubbing or edema noted bilaterally Neuro: Cranial nerves II-XII intact, no focal neurological deficits   The results of significant diagnostics from this hospitalization (including imaging, microbiology, ancillary and laboratory) are listed below for reference.      Procedures/Studies: Study Conclusions  - Left ventricle: The cavity size was normal. Wall thickness was   increased in a pattern of moderate LVH. Systolic function was   normal. The estimated ejection fraction was in the range of 60%   to 65%. Wall motion was normal; there were no regional wall   motion abnormalities. - Pericardium, extracardiac: A trivial pericardial effusion was   identified.   Dg Chest Portable 1 View  Result Date: 06/07/2017 CLINICAL DATA:  52 year old female with AFib. EXAM: PORTABLE CHEST 1 VIEW COMPARISON:  Chest radiograph dated 01/05/2016 FINDINGS: The heart size and mediastinal contours are within normal limits. Both lungs are clear. The visualized skeletal structures are unremarkable. IMPRESSION: No active disease. Electronically Signed   By: Elgie CollardArash  Radparvar M.D.   On: 06/07/2017 03:11   Mm Screening Breast Tomo Bilateral  Result Date: 06/04/2017 CLINICAL DATA:  Screening. EXAM: DIGITAL SCREENING BILATERAL MAMMOGRAM WITH TOMO AND CAD COMPARISON:   Previous exam(s). ACR Breast Density Category b: There are scattered areas of fibroglandular density. FINDINGS: There are no findings suspicious for malignancy. Images were processed with CAD. IMPRESSION: No mammographic evidence of malignancy. A result letter of this screening mammogram will be mailed directly to the patient. RECOMMENDATION: Screening mammogram in one year. (Code:SM-B-01Y) BI-RADS CATEGORY  1: Negative. Electronically Signed   By: Norva PavlovElizabeth  Brown M.D.   On: 06/04/2017 15:56       LAB RESULTS: Basic Metabolic Panel: Recent Labs  Lab 06/07/17 0247 06/07/17 0510 06/08/17 0745  NA 145  --  136  K 3.2*  --  4.0  CL 107  --  105  CO2  --   --  23  GLUCOSE 133*  --  106*  BUN 9  --  13  CREATININE 0.80  --  0.79  CALCIUM  --   --  9.5  MG  --  1.8  --    Liver Function Tests: Recent Labs  Lab 06/07/17 1058  AST 36  ALT 28  ALKPHOS 66  BILITOT 0.9  PROT 7.3  ALBUMIN 3.5   No results for input(s): LIPASE, AMYLASE in the last 168 hours. No results for input(s): AMMONIA in the last 168  hours. CBC: Recent Labs  Lab 06/07/17 0238 06/07/17 0247  WBC 4.4  --   HGB 14.8 16.0*  HCT 44.3 47.0*  MCV 88.1  --   PLT 193  --    Cardiac Enzymes: Recent Labs  Lab 06/07/17 1058 06/07/17 1828  TROPONINI <0.03 <0.03   BNP: Invalid input(s): POCBNP CBG: No results for input(s): GLUCAP in the last 168 hours.    Disposition and Follow-up: Discharge Instructions    Diet - low sodium heart healthy   Complete by:  As directed    Increase activity slowly   Complete by:  As directed        DISPOSITION: Home   DISCHARGE FOLLOW-UP Follow-up Information    Coralee Rud, PA-C. Schedule an appointment as soon as possible for a visit in 2 week(s).   Specialty:  Cardiology Contact information: [facility]  [address] [place] [address] [phone]            Time coordinating discharge:  25 minutes  Signed:   Thad Ranger M.D. [facility] 06/08/2017, 1:02 PM Pager: [phone]

## 2017-06-08 NOTE — Progress Notes (Signed)
Patient converted to NSR. EKG Taken. MD notified. Verbal orders to stop cardizem drip. Keep NPO for cardioversion tomorrow just in case.

## 2017-06-08 NOTE — Progress Notes (Signed)
Spoke with Judeth CornfieldStephanie, pt's RN on 06/08/17, who states that the patient is in NSR. Pt's procedure cancelled today per cariology PA. Brt, rn

## 2017-12-13 ENCOUNTER — Other Ambulatory Visit: Payer: Self-pay | Admitting: Internal Medicine

## 2017-12-13 DIAGNOSIS — E042 Nontoxic multinodular goiter: Secondary | ICD-10-CM

## 2017-12-17 ENCOUNTER — Ambulatory Visit
Admission: RE | Admit: 2017-12-17 | Discharge: 2017-12-17 | Disposition: A | Discharge status: Home / Self Care | Payer: BLUE CROSS/BLUE SHIELD | Source: Ambulatory Visit | Attending: Internal Medicine | Admitting: Internal Medicine

## 2017-12-17 DIAGNOSIS — E042 Nontoxic multinodular goiter: Secondary | ICD-10-CM

## 2018-02-01 ENCOUNTER — Emergency Department (HOSPITAL_COMMUNITY)
Admission: EM | Admit: 2018-02-01 | Discharge: 2018-02-02 | Disposition: A | Discharge status: Home / Self Care | Payer: BLUE CROSS/BLUE SHIELD | Attending: Emergency Medicine | Admitting: Emergency Medicine

## 2018-02-01 ENCOUNTER — Other Ambulatory Visit: Payer: Self-pay

## 2018-02-01 DIAGNOSIS — I10 Essential (primary) hypertension: Secondary | ICD-10-CM | POA: Insufficient documentation

## 2018-02-01 DIAGNOSIS — Z79899 Other long term (current) drug therapy: Secondary | ICD-10-CM | POA: Diagnosis not present

## 2018-02-01 DIAGNOSIS — M7542 Impingement syndrome of left shoulder: Secondary | ICD-10-CM | POA: Insufficient documentation

## 2018-02-01 DIAGNOSIS — Z7901 Long term (current) use of anticoagulants: Secondary | ICD-10-CM | POA: Insufficient documentation

## 2018-02-01 DIAGNOSIS — M659 Synovitis and tenosynovitis, unspecified: Secondary | ICD-10-CM | POA: Insufficient documentation

## 2018-02-01 DIAGNOSIS — M79602 Pain in left arm: Secondary | ICD-10-CM | POA: Diagnosis present

## 2018-02-01 DIAGNOSIS — E039 Hypothyroidism, unspecified: Secondary | ICD-10-CM | POA: Insufficient documentation

## 2018-02-01 DIAGNOSIS — M6790 Unspecified disorder of synovium and tendon, unspecified site: Secondary | ICD-10-CM

## 2018-02-01 NOTE — ED Triage Notes (Signed)
Patient states her left arm began to hurt last night. Denies any chest pain, lightheadedness, or dizziness.

## 2018-02-02 ENCOUNTER — Emergency Department (HOSPITAL_COMMUNITY): Payer: BLUE CROSS/BLUE SHIELD

## 2018-02-02 DIAGNOSIS — M7542 Impingement syndrome of left shoulder: Secondary | ICD-10-CM | POA: Diagnosis not present

## 2018-02-02 MED ORDER — ONDANSETRON 4 MG PO TBDP
4.0000 mg | ORAL_TABLET | Freq: Once | ORAL | Status: AC
  Administered 2018-02-02: 4 mg via ORAL
  Filled 2018-02-02: qty 1

## 2018-02-02 MED ORDER — IBUPROFEN 400 MG PO TABS: 400.0000 mg | ORAL_TABLET | Freq: Two times a day (BID) | ORAL | 0 refills | Status: AC

## 2018-02-02 MED ORDER — HYDROCODONE-ACETAMINOPHEN 5-325 MG PO TABS
1.0000 | ORAL_TABLET | Freq: Once | ORAL | Status: AC
  Administered 2018-02-02: 1 via ORAL
  Filled 2018-02-02: qty 1

## 2018-02-02 MED ORDER — OXYCODONE-ACETAMINOPHEN 5-325 MG PO TABS: 1.0000 | ORAL_TABLET | Freq: Three times a day (TID) | ORAL | 0 refills | Status: AC | PRN

## 2018-02-02 NOTE — ED Provider Notes (Signed)
[facility] Provider Note   CSN: [id number] Arrival date & time: 02/01/18  2200     History   Chief Complaint Chief Complaint  Patient presents with  . Arm Pain    HPI Rachel [name] is a 52 y.o. female.  HPI 52 year old female comes in with chief complaint of arm pain.  Patient reports that she has had off and on arm pain over her left upper extremity, however yesterday her pain started in the evening and it has gotten worse.  The pain is constant and she has worsening of her pain with any movement.  Patient has past medical history of autoimmune hepatitis for which she is on immunomodulators.  She is right-handed and denies any specific precipitating factor for her arm pain.  There is no associated numbness, tingling.  Patient does not have any neck pain or chest pain.  Past Medical History:  Diagnosis Date  . Abnormal pap   . Abnormal Pap smear   . Autoimmune hepatitis (HCC)   . Gastroparesis   . Hypertension   . Thyroid disease    "nodule"    Patient Active Problem List   Diagnosis Date Noted  . Atrial fibrillation with RVR (HCC) 06/07/2017  . Autoimmune hepatitis (HCC) 06/07/2017  . Transaminitis   . Acute hepatitis 08/29/2016  . Jaundice 08/29/2016  . Hypokalemia 08/29/2016  . Hypothyroidism 08/29/2016  . Essential hypertension 08/29/2016  . Acute lower UTI 08/29/2016  . Abnormal pap   . GASTROPARESIS 07/26/2008  . OBESITY 05/12/2007  . GERD 11/03/2005    Past Surgical History:  Procedure Laterality Date  . COLONOSCOPY    . HERNIA REPAIR    . JOINT REPLACEMENT    . KNEE SURGERY    . MYOMECTOMY    . THROAT SURGERY       OB History    Gravida  2   Para  2   Term      Preterm      AB      Living  0     SAB      TAB      Ectopic      Multiple      Live Births               Home Medications    Prior to Admission medications   Medication Sig Start Date End Date Taking? Authorizing  Provider  albuterol (PROVENTIL HFA;VENTOLIN HFA) 108 (90 Base) MCG/ACT inhaler Inhale 1-2 puffs into the lungs every 6 (six) hours as needed for wheezing or shortness of breath.    [provider]  apixaban (ELIQUIS) 5 MG TABS tablet Take 1 tablet (5 mg total) by mouth 2 (two) times daily. 06/08/17   Rai, Ripudeep Kirtland BouchardK, MD  CALCIUM-MAGNESIUM-ZINC PO Take 1 tablet by mouth daily.    [provider]  cholecalciferol (VITAMIN D) 1000 UNITS tablet Take 1,000 Units by mouth daily.    [provider]  cycloSPORINE (RESTASIS) 0.05 % ophthalmic emulsion Place 1 drop into both eyes 2 (two) times daily as needed (dryness).     [provider]  fish oil-omega-3 fatty acids 1000 MG capsule Take 2 g by mouth daily.    [provider]  flecainide (TAMBOCOR) 50 MG tablet Take 1 tablet (50 mg total) by mouth 2 (two) times daily. 06/08/17   Rai, Delene Ruffiniipudeep K, MD  ibuprofen (ADVIL,MOTRIN) 400 MG tablet Take 1 tablet (400 mg total) by mouth 2 (two)  times daily. 02/02/18   Derwood Kaplan, MD  levothyroxine (SYNTHROID, LEVOTHROID) 50 MCG tablet Take 50 mcg by mouth every other day.    [provider]  levothyroxine (SYNTHROID, LEVOTHROID) 75 MCG tablet Take 75 mcg by mouth every other day.     [provider]  mercaptopurine (PURINETHOL) 50 MG tablet Take 75 mg by mouth daily. Give on an empty stomach 1 hour before or 2 hours after meals. Caution: Chemotherapy.    [provider]  metoprolol tartrate (LOPRESSOR) 25 MG tablet Take 1 tablet (25 mg total) by mouth 2 (two) times daily. 06/08/17   Rai, Ripudeep K, MD  montelukast (SINGULAIR) 10 MG tablet Take 10 mg by mouth at bedtime.    [provider]  olmesartan-hydrochlorothiazide (BENICAR HCT) 40-12.5 MG tablet Take 1 tablet by mouth daily.    [provider]  oxyCODONE-acetaminophen (PERCOCET/ROXICET) 5-325 MG tablet Take 1 tablet by mouth every 8 (eight) hours as needed for severe pain.  02/02/18   Derwood Kaplan, MD  Probiotic Product (DAILY PROBIOTIC PO) Take 1 tablet by mouth daily.    [provider]    Family History Family History  Problem Relation Age of Onset  . Heart disease Maternal Grandmother   . Diabetes Maternal Grandmother   . Colon cancer Father   . Heart attack Mother        CAD, stents in her 51's  . Diabetes Mother   . Hypertension Brother   . Multiple sclerosis Sister   . Diabetes Sister   . Hypertension Sister   . Breast cancer Cousin   . Hypertension Brother     Social History Social History   Tobacco Use  . Smoking status: Never Smoker  . Smokeless tobacco: Never Used  Substance Use Topics  . Alcohol use: Yes  . Drug use: No     Allergies   Oxycodone-acetaminophen   Review of Systems Review of Systems  Constitutional: Positive for activity change.  Cardiovascular: Negative for chest pain.  Musculoskeletal: Positive for arthralgias.  Skin: Negative for rash and wound.  Neurological: Negative for numbness.     Physical Exam Updated Vital Signs BP (!) 182/112   Pulse 84   Temp 98.8 F (37.1 C) (Oral)   Resp 16   Ht 5\' 7"  (1.702 m)   Wt 98.9 kg   SpO2 99%   BMI 34.14 kg/m   Physical Exam  Constitutional: She appears well-developed.  HENT:  Head: Atraumatic.  Eyes: EOM are normal.  Neck: Neck supple.  Cardiovascular: Normal rate and intact distal pulses.  Pulmonary/Chest: Effort normal.  Abdominal: Bowel sounds are normal.  Musculoskeletal:  Patient is tolerating passive range of motion (abduction-abduction and gentle rotation of the shoulder), she is unable to actively abduct or forward flex.  There is no edema or erythema or calor over the shoulder l  Neurological: She is alert.  Skin: Skin is warm.  Nursing note and vitals reviewed.    ED Treatments / Results  Labs (all labs ordered are listed, but only abnormal results are displayed) Labs Reviewed - No data to display  EKG EKG  Interpretation  Date/Time:  Tuesday February 01 2018 22:05:25 EST Ventricular Rate:  111 PR Interval:    QRS Duration: 78 QT Interval:  464 QTC Calculation: 631 R Axis:   46 Text Interpretation:  Sinus tachycardia Nonspecific T wave abnormality Abnormal ECG No acute changes    Confirmed by Derwood Kaplan [phone]) on 02/02/2018 12:03:41 AM   Radiology  Dg Shoulder Left  Result Date: 02/02/2018 CLINICAL DATA:  Progressive left shoulder pain for 1 day. No known injury. EXAM: LEFT SHOULDER - 2+ VIEW COMPARISON:  None. FINDINGS: There is no evidence of fracture or dislocation. Two rounded soft tissue calcifications posterior to the humeral head seen only on the transscapular Y-view. Trace superior glenoid spurring. Minimal acromioclavicular osteoarthritis. IMPRESSION: 1. Rounded soft tissue calcifications adjacent to the humeral head suspicious for calcific tendinopathy. Ossified intra-articular bodies felt less likely given location. 2. Minimal acromioclavicular osteoarthritis. Trace superior glenoid spurring. Electronically Signed   By: Narda Rutherford M.D.   On: 02/02/2018 02:42    Procedures Procedures (including critical care time)  Medications Ordered in ED Medications  HYDROcodone-acetaminophen (NORCO/VICODIN) 5-325 MG per tablet 1 tablet (1 tablet Oral Given 02/02/18 0159)  ondansetron (ZOFRAN-ODT) disintegrating tablet 4 mg (4 mg Oral Given 02/02/18 0159)     Initial Impression / Assessment and Plan / ED Course  I have reviewed the triage vital signs and the nursing notes.  Pertinent labs & imaging results that were available during my care of the patient were reviewed by me and considered in my medical decision making (see chart for details).     52 year old comes in with chief complaint of shoulder pain.  Based on her exam it does not seem like there is underlying septic joint.  X-ray ordered and it confirms our suspicion for impingement syndrome, as there is significant  calcification consistent with tendinitis. Results of the ER work-up has been discussed with the patient.  We will continue with conservative management of her pain and have her follow-up with orthopedics.  Final Clinical Impressions(s) / ED Diagnoses   Final diagnoses:  Impingement syndrome of left shoulder  Tendinopathy    ED Discharge Orders         Ordered    ibuprofen (ADVIL,MOTRIN) 400 MG tablet  2 times daily     02/02/18 0329    oxyCODONE-acetaminophen (PERCOCET/ROXICET) 5-325 MG tablet  Every 8 hours PRN     02/02/18 0329           Derwood Kaplan, MD 02/02/18 [phone]

## 2018-02-02 NOTE — Discharge Instructions (Addendum)
Please follow-up with orthopedic doctor in 1 week.  X-ray shows calcified tendinopathy, which we think is causing the pain.

## 2018-04-21 ENCOUNTER — Other Ambulatory Visit: Payer: Self-pay | Admitting: Cardiology

## 2018-04-21 DIAGNOSIS — Z1231 Encounter for screening mammogram for malignant neoplasm of breast: Secondary | ICD-10-CM

## 2018-06-06 ENCOUNTER — Ambulatory Visit: Payer: BLUE CROSS/BLUE SHIELD

## 2018-07-05 IMAGING — US US BIOPSY
1 series · 8 of 8 positions shown · non-contrast
Comparison: none

CLINICAL DATA: Elevated liver function tests and abnormal anti
smooth muscle antibody. Referral for random liver biopsy for further
evaluation.

[Series 1: us biopsy · 0.26mm/px · 8 of 8 slices shown]
[im 1/8]
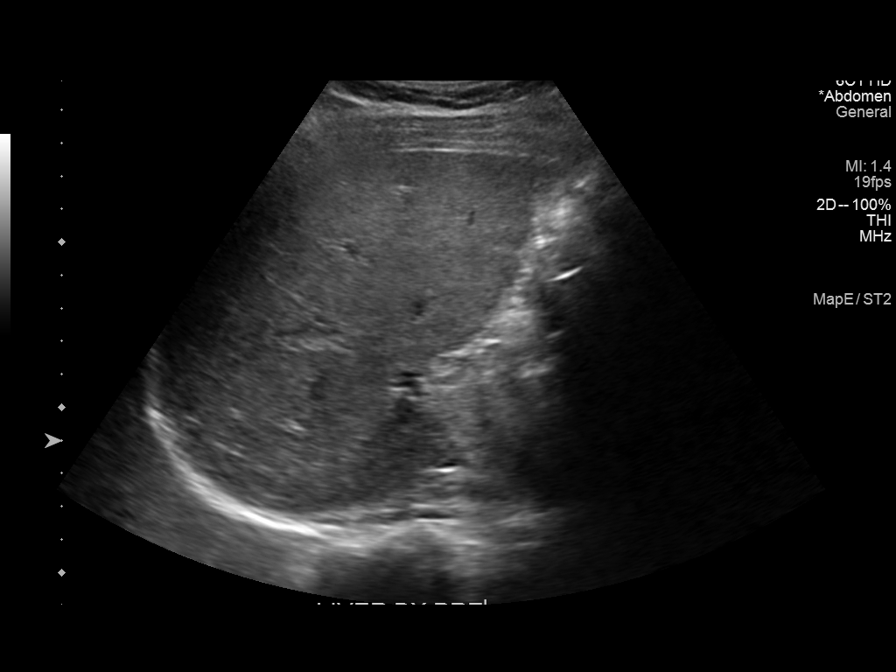
[im 2/8]
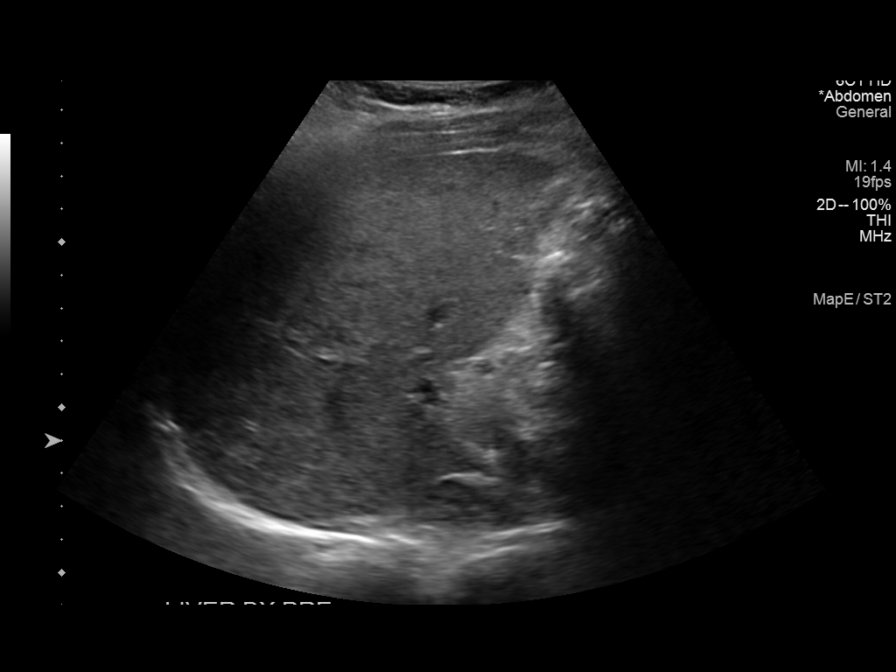
[im 3/8]
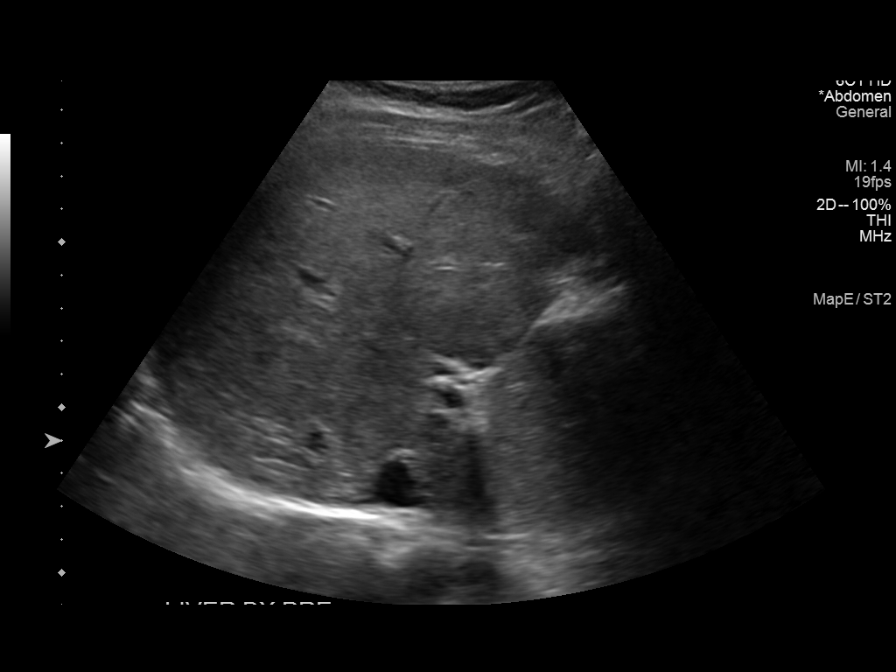
[im 4/8]
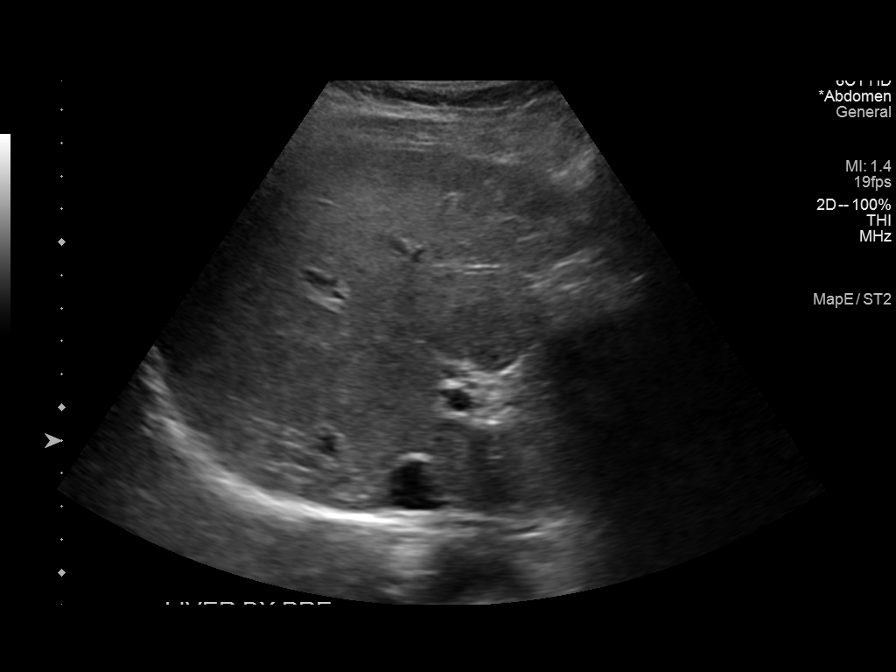
[im 5/8]
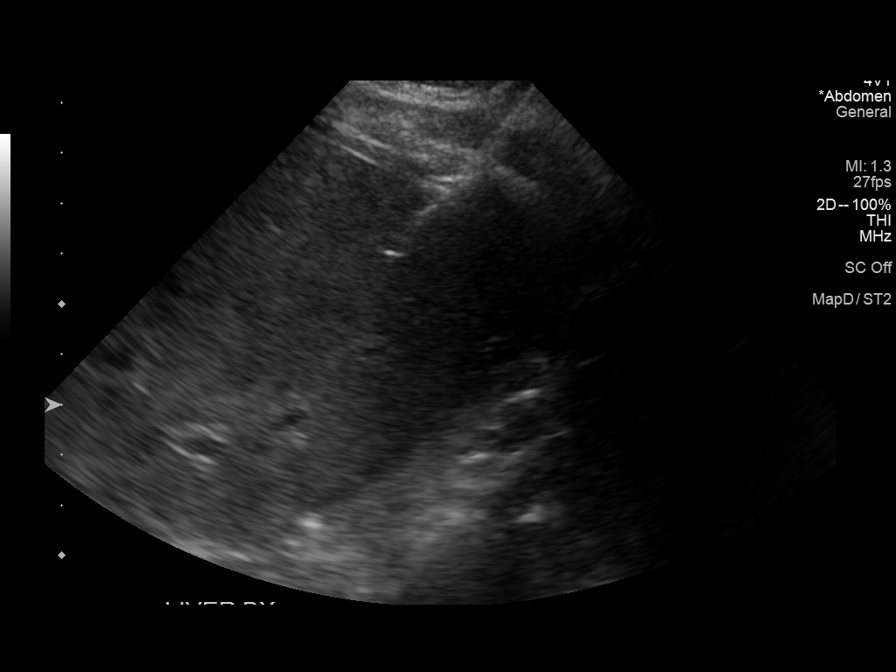
[im 6/8]
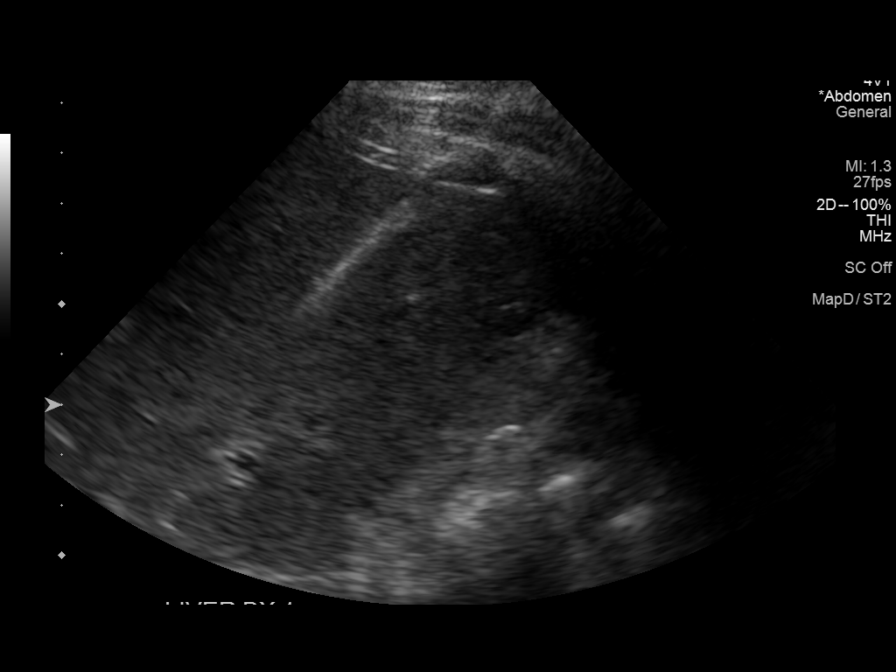
[im 7/8]
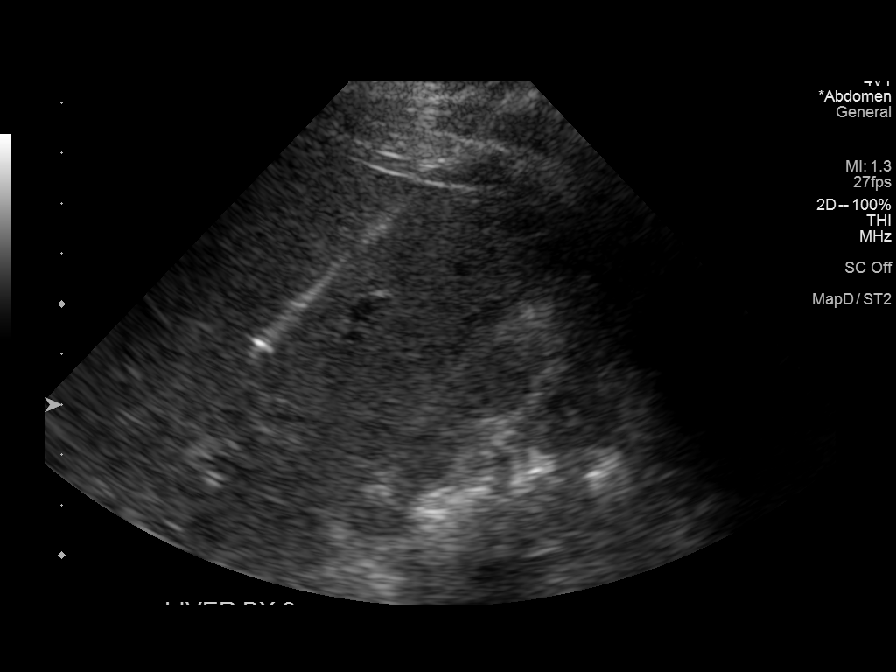
[im 8/8]
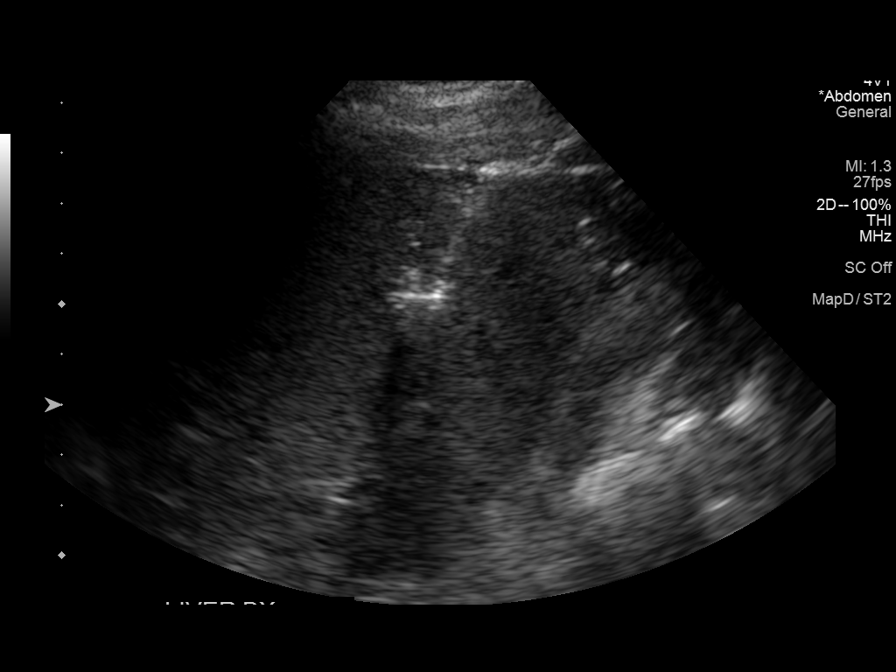

[8 of 8 positions shown; findings below may reference images not displayed]

EXAM:
ULTRASOUND GUIDED CORE BIOPSY OF LIVER

MEDICATIONS:
2.0 mg IV Versed; 100 mcg IV Fentanyl

Total Moderate Sedation Time: 11 minutes.

The patient's level of consciousness and physiologic status were
continuously monitored during the procedure by Radiology nursing.

PROCEDURE:
The procedure, risks, benefits, and alternatives were explained to
the patient. Questions regarding the procedure were encouraged and
answered. The patient understands and consents to the procedure. A
time out was performed prior to initiating the procedure.

The right abdominal wall was prepped with chlorhexidine in a sterile
fashion, and a sterile drape was applied covering the operative
field. A sterile gown and sterile gloves were used for the
procedure. Local anesthesia was provided with 1% Lidocaine.

Under ultrasound guidance, a 17 gauge needle was advanced into the
right lobe of the liver. After confirming needle tip position, 2
separate coaxial 18 gauge core biopsy samples were obtained of the
liver. Samples were submitted in formalin. Gel-Foam pledgets were
advanced through the outer needle as it was withdrawn. Additional
ultrasound was performed after outer needle removal.

COMPLICATIONS:
None.
FINDINGS: Solid tissue was obtained from the liver. There were no immediate
bleeding complications.
IMPRESSION: Ultrasound-guided core biopsy performed within the right lobe of the
liver.

## 2018-07-07 ENCOUNTER — Ambulatory Visit: Payer: BLUE CROSS/BLUE SHIELD

## 2018-08-09 ENCOUNTER — Ambulatory Visit
Admission: RE | Admit: 2018-08-09 | Discharge: 2018-08-09 | Disposition: A | Discharge status: Home / Self Care | Payer: BLUE CROSS/BLUE SHIELD | Source: Ambulatory Visit | Attending: Cardiology | Admitting: Cardiology

## 2018-08-09 ENCOUNTER — Other Ambulatory Visit: Payer: Self-pay

## 2018-08-09 DIAGNOSIS — Z1231 Encounter for screening mammogram for malignant neoplasm of breast: Secondary | ICD-10-CM

## 2019-03-03 IMAGING — US US ABDOMEN LIMITED
1 series · 14 of 25 positions shown · non-contrast
Comparison: None.

CLINICAL DATA: Acute onset of right upper quadrant abdominal pain.
Initial encounter.

EXAM:
ULTRASOUND ABDOMEN LIMITED RIGHT UPPER QUADRANT

[Series 1: us abdomen limited · 0.26mm/px · 14 of 64 slices shown]
[im 1/64]
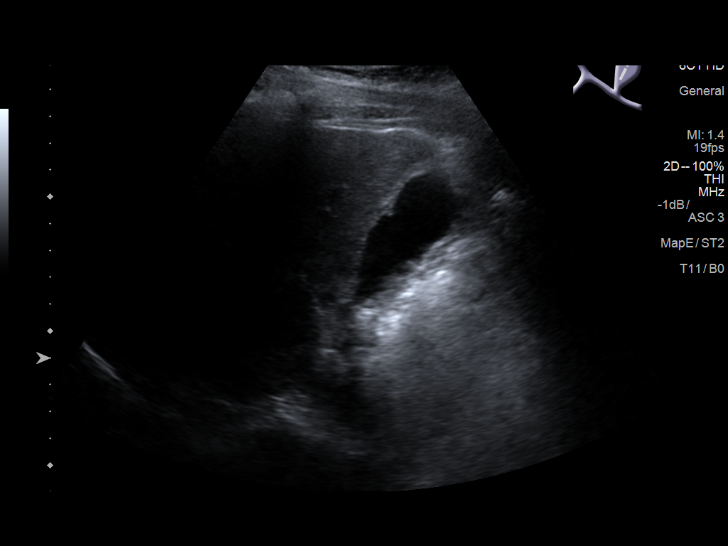
[im 6/64]
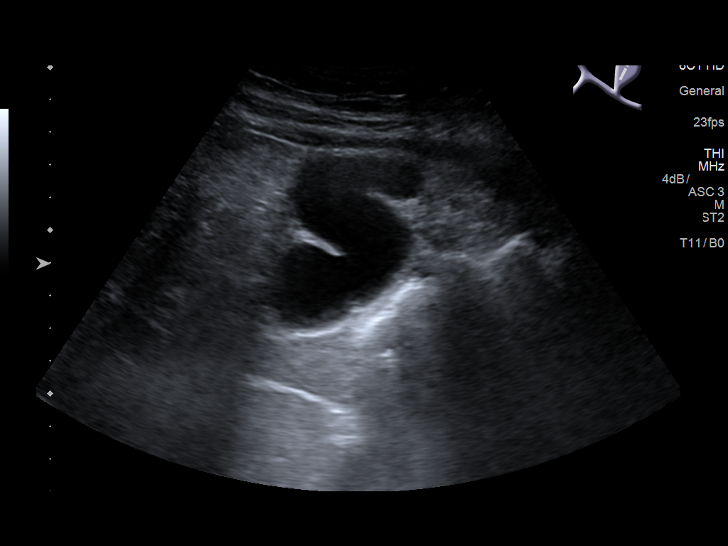
[im 11/64]
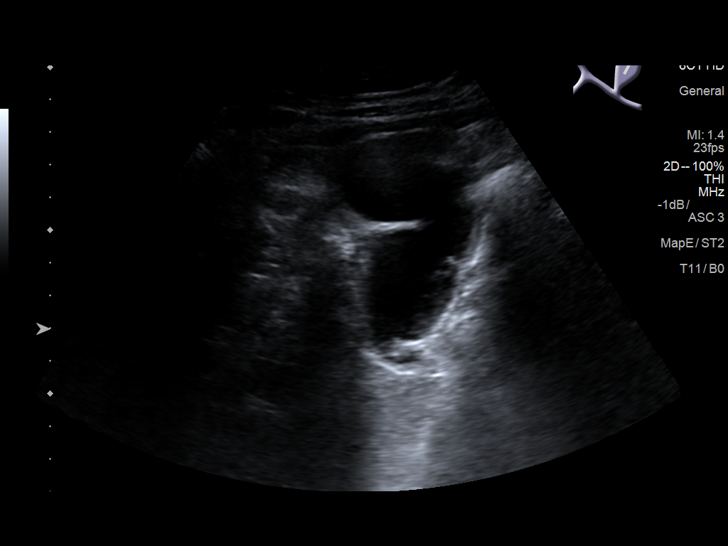
[im 16/64]
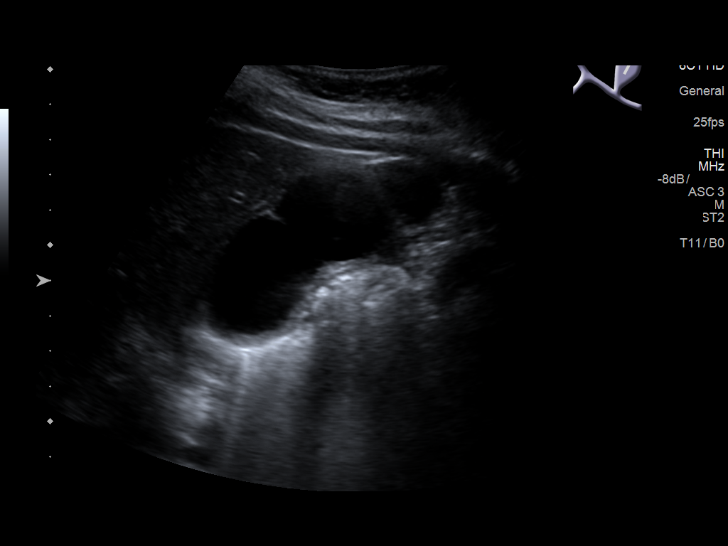
[im 22/64]
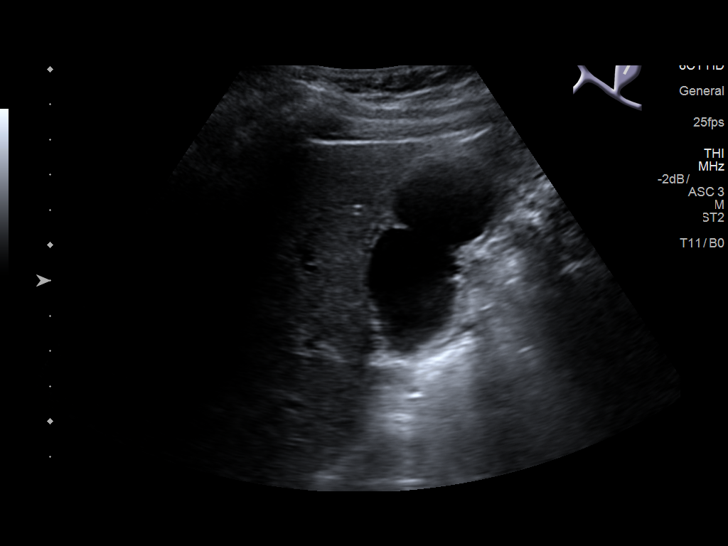
[im 24/64]
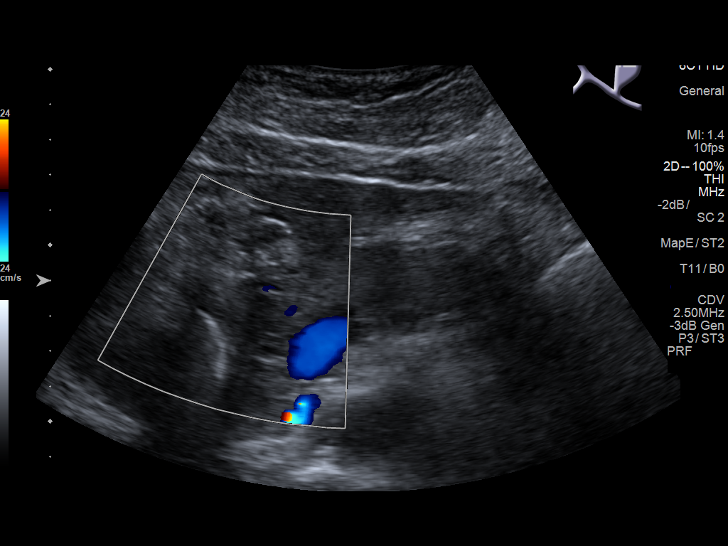
[im 29/64]
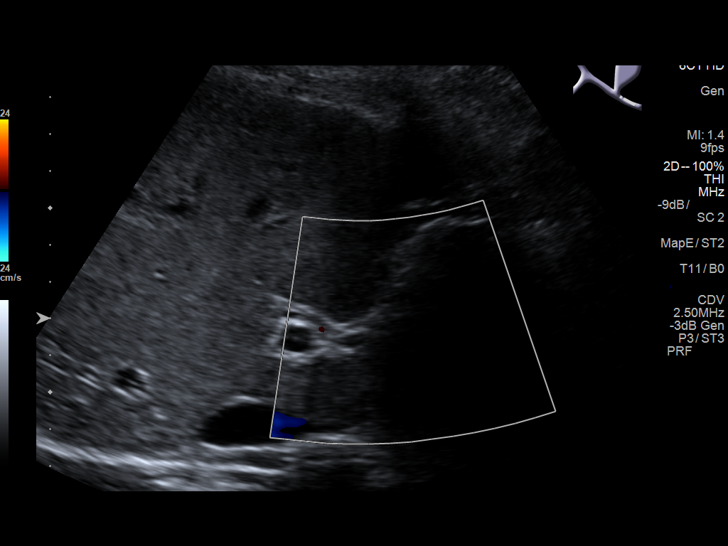
[im 35/64]
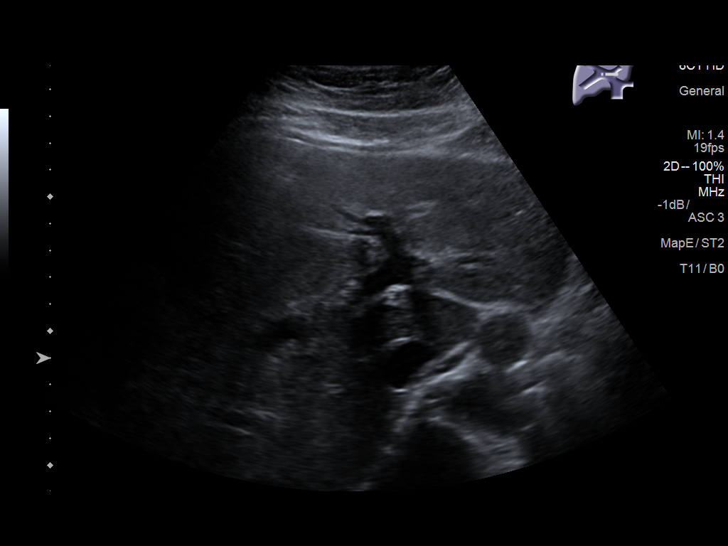
[im 40/64]
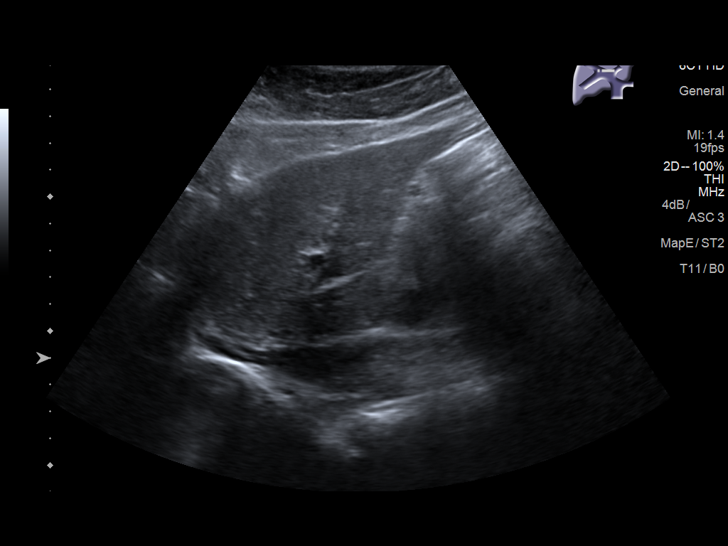
[im 43/64]
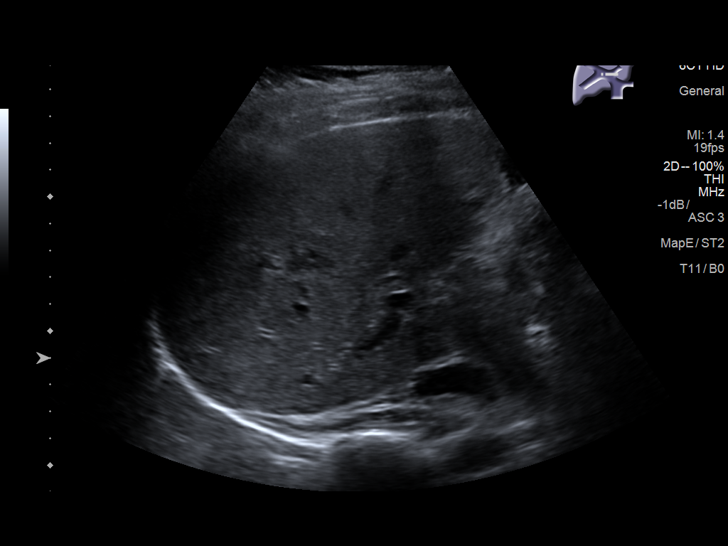
[im 48/64]
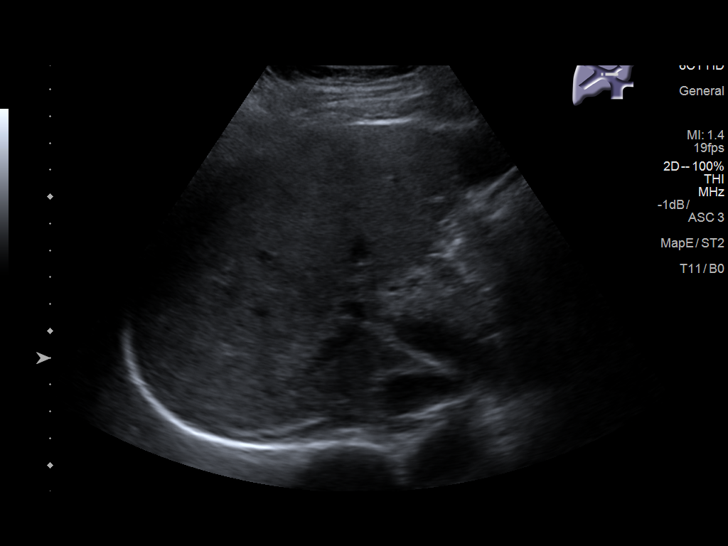
[im 53/64]
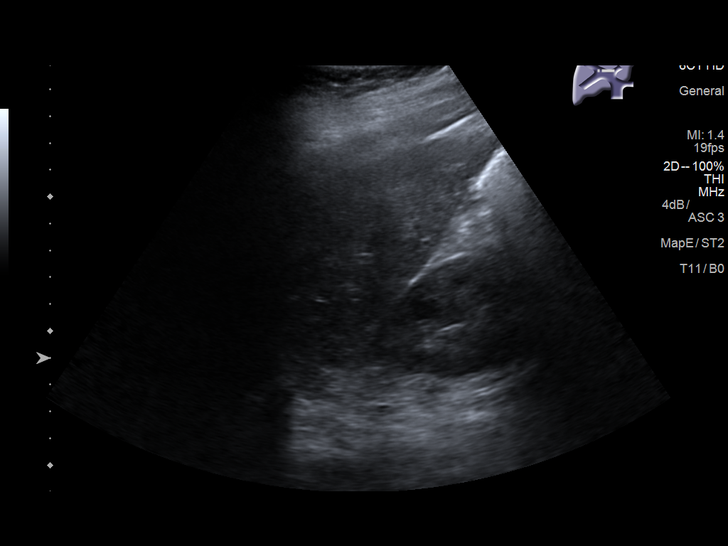
[im 58/64]
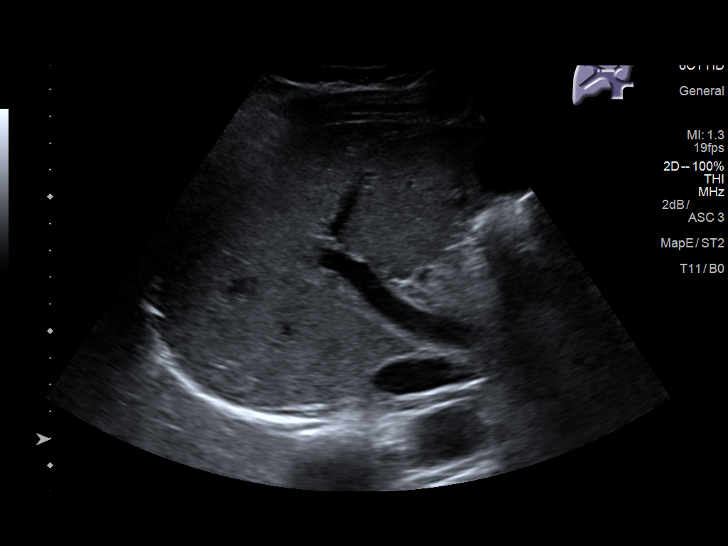
[im 64/64]
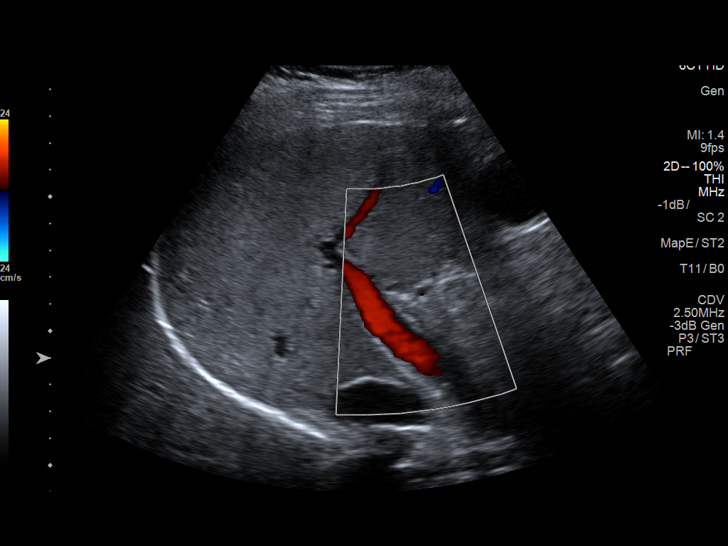

[14 of 25 positions shown; findings below may reference images not displayed]

FINDINGS: Gallbladder:

No gallstones or wall thickening visualized. No sonographic Murphy
sign noted by sonographer.

Common bile duct:

Diameter: 0.3 cm, within normal limits in caliber.

Liver:

No focal lesion identified. Within normal limits in parenchymal
echogenicity.
IMPRESSION: Unremarkable ultrasound of the right upper quadrant.

## 2019-04-04 IMAGING — DX DG CHEST 1V PORT
1 series · 1 of 1 positions shown · non-contrast
Comparison: Chest radiograph dated 01/05/2016

CLINICAL DATA: 51-year-old female with AFib.

EXAM:
PORTABLE CHEST 1 VIEW

[chest ap]
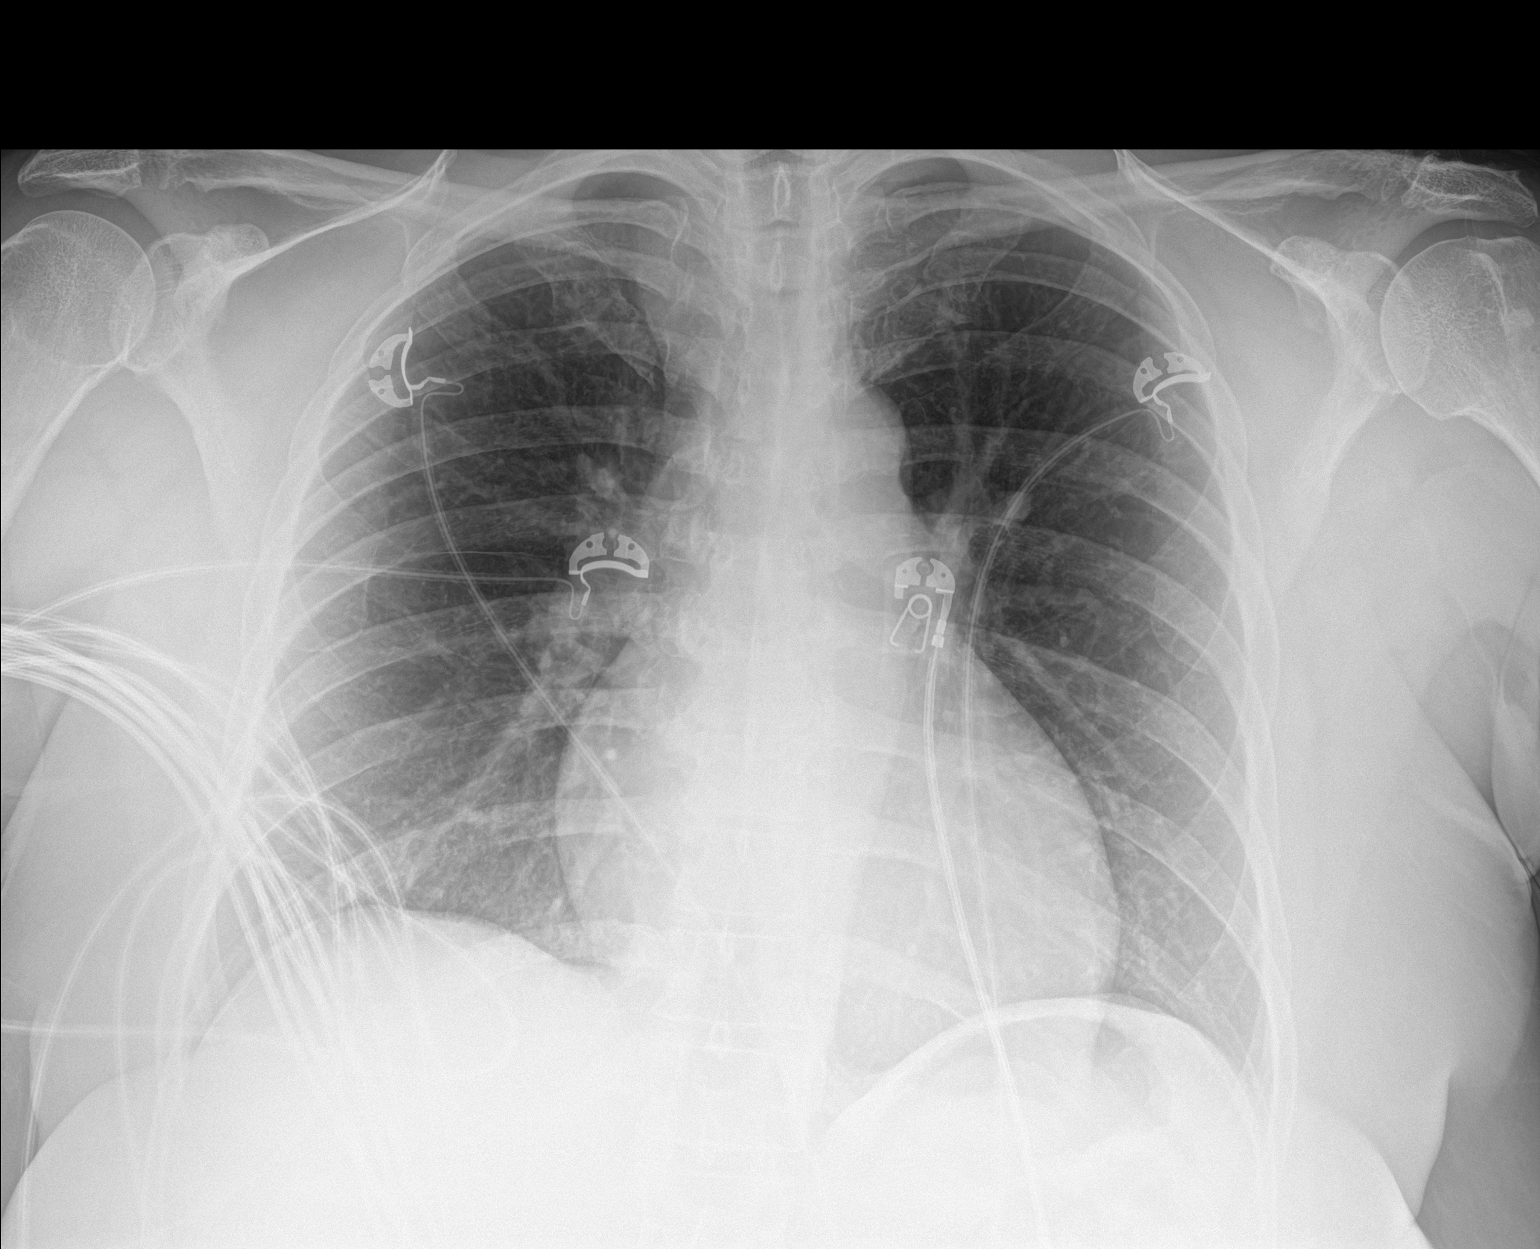

[1 of 1 positions shown; findings below may reference images not displayed]

FINDINGS: The heart size and mediastinal contours are within normal limits.
Both lungs are clear. The visualized skeletal structures are
unremarkable.
IMPRESSION: No active disease.

## 2019-07-07 ENCOUNTER — Other Ambulatory Visit: Payer: Self-pay | Admitting: Cardiology

## 2019-07-07 DIAGNOSIS — Z1231 Encounter for screening mammogram for malignant neoplasm of breast: Secondary | ICD-10-CM

## 2019-08-10 ENCOUNTER — Other Ambulatory Visit: Payer: Self-pay

## 2019-08-10 ENCOUNTER — Ambulatory Visit
Admission: RE | Admit: 2019-08-10 | Discharge: 2019-08-10 | Disposition: A | Discharge status: Home / Self Care | Payer: BC Managed Care – PPO | Source: Ambulatory Visit | Attending: Cardiology | Admitting: Cardiology

## 2019-08-10 ENCOUNTER — Other Ambulatory Visit: Payer: Self-pay | Admitting: Obstetrics and Gynecology

## 2019-08-10 DIAGNOSIS — Z1231 Encounter for screening mammogram for malignant neoplasm of breast: Secondary | ICD-10-CM

## 2020-01-16 ENCOUNTER — Ambulatory Visit: Payer: BC Managed Care – PPO | Attending: Internal Medicine

## 2020-01-16 DIAGNOSIS — Z23 Encounter for immunization: Secondary | ICD-10-CM

## 2020-01-16 NOTE — Progress Notes (Signed)
   Covid-19 Vaccination Clinic  Name:  Rachel [name]    MRN: [id number] DOB: 07-06-1965  01/16/2020  Rachel [name] was observed post Covid-19 immunization for 15 minutes without incident. She was provided with Vaccine Information Sheet and instruction to access the V-Safe system.   Rachel [name] was instructed to call [phone] with any severe reactions post vaccine: Marland Kitchen Difficulty breathing  . Swelling of face and throat  . A fast heartbeat  . A bad rash all over body  . Dizziness and weakness

## 2020-06-27 ENCOUNTER — Other Ambulatory Visit: Payer: Self-pay | Admitting: Obstetrics and Gynecology

## 2020-06-27 DIAGNOSIS — Z1231 Encounter for screening mammogram for malignant neoplasm of breast: Secondary | ICD-10-CM

## 2020-08-16 ENCOUNTER — Ambulatory Visit
Admission: RE | Admit: 2020-08-16 | Discharge: 2020-08-16 | Disposition: A | Discharge status: Home / Self Care | Payer: BC Managed Care – PPO | Source: Ambulatory Visit | Attending: Obstetrics and Gynecology | Admitting: Obstetrics and Gynecology

## 2020-08-16 ENCOUNTER — Other Ambulatory Visit: Payer: Self-pay

## 2020-08-16 DIAGNOSIS — Z1231 Encounter for screening mammogram for malignant neoplasm of breast: Secondary | ICD-10-CM

## 2020-09-11 ENCOUNTER — Other Ambulatory Visit: Payer: Self-pay

## 2020-09-11 ENCOUNTER — Ambulatory Visit: Payer: BC Managed Care – PPO | Attending: Internal Medicine

## 2020-09-11 ENCOUNTER — Other Ambulatory Visit (HOSPITAL_BASED_OUTPATIENT_CLINIC_OR_DEPARTMENT_OTHER): Payer: Self-pay

## 2020-09-11 DIAGNOSIS — Z23 Encounter for immunization: Secondary | ICD-10-CM

## 2020-09-11 MED ORDER — PFIZER-BIONT COVID-19 VAC-TRIS 30 MCG/0.3ML IM SUSP
INTRAMUSCULAR | 0 refills | Status: DC
  Filled 2020-09-11: qty 0.3, 1d supply, fill #0

## 2021-07-04 ENCOUNTER — Other Ambulatory Visit: Payer: Self-pay | Admitting: Obstetrics and Gynecology

## 2021-07-04 DIAGNOSIS — Z1231 Encounter for screening mammogram for malignant neoplasm of breast: Secondary | ICD-10-CM

## 2021-07-18 ENCOUNTER — Emergency Department (HOSPITAL_COMMUNITY): Payer: BC Managed Care – PPO

## 2021-07-18 ENCOUNTER — Encounter (HOSPITAL_COMMUNITY): Payer: Self-pay | Admitting: Emergency Medicine

## 2021-07-18 ENCOUNTER — Other Ambulatory Visit: Payer: Self-pay

## 2021-07-18 ENCOUNTER — Emergency Department (HOSPITAL_COMMUNITY)
Admission: EM | Admit: 2021-07-18 | Discharge: 2021-07-18 | Disposition: A | Discharge status: Home / Self Care | Payer: BC Managed Care – PPO | Attending: Emergency Medicine | Admitting: Emergency Medicine

## 2021-07-18 DIAGNOSIS — R002 Palpitations: Secondary | ICD-10-CM | POA: Diagnosis present

## 2021-07-18 DIAGNOSIS — Z7901 Long term (current) use of anticoagulants: Secondary | ICD-10-CM | POA: Insufficient documentation

## 2021-07-18 DIAGNOSIS — I4891 Unspecified atrial fibrillation: Secondary | ICD-10-CM | POA: Diagnosis not present

## 2021-07-18 LAB — CBC
HCT: 46.9 % — ABNORMAL HIGH (ref 36.0–46.0)
Hemoglobin: 15.6 g/dL — ABNORMAL HIGH (ref 12.0–15.0)
MCH: 29.1 pg (ref 26.0–34.0)
MCHC: 33.3 g/dL (ref 30.0–36.0)
MCV: 87.5 fL (ref 80.0–100.0)
Platelets: 298 10*3/uL (ref 150–400)
RBC: 5.36 MIL/uL — ABNORMAL HIGH (ref 3.87–5.11)
RDW: 14.5 % (ref 11.5–15.5)
WBC: 4.4 10*3/uL (ref 4.0–10.5)
nRBC: 0 % (ref 0.0–0.2)

## 2021-07-18 LAB — BASIC METABOLIC PANEL
Anion gap: 10 (ref 5–15)
BUN: 12 mg/dL (ref 6–20)
CO2: 23 mmol/L (ref 22–32)
Calcium: 10.4 mg/dL — ABNORMAL HIGH (ref 8.9–10.3)
Chloride: 107 mmol/L (ref 98–111)
Creatinine, Ser: 1.07 mg/dL — ABNORMAL HIGH (ref 0.44–1.00)
GFR, Estimated: >60 mL/min (ref >60)
Glucose, Bld: 128 mg/dL — ABNORMAL HIGH (ref 70–99)
Potassium: 3.6 mmol/L (ref 3.5–5.1)
Sodium: 140 mmol/L (ref 135–145)

## 2021-07-18 LAB — MAGNESIUM: Magnesium: 2 mg/dL (ref 1.7–2.4)

## 2021-07-18 MED ORDER — FENTANYL CITRATE PF 50 MCG/ML IJ SOSY
50.0000 ug | PREFILLED_SYRINGE | Freq: Once | INTRAMUSCULAR | Status: AC
  Administered 2021-07-18: 50 ug via INTRAVENOUS
  Filled 2021-07-18: qty 1

## 2021-07-18 MED ORDER — DILTIAZEM LOAD VIA INFUSION
20.0000 mg | Freq: Once | INTRAVENOUS | Status: AC
  Administered 2021-07-18: 20 mg via INTRAVENOUS
  Filled 2021-07-18: qty 20

## 2021-07-18 MED ORDER — PROPOFOL 10 MG/ML IV BOLUS
0.5000 mg/kg | Freq: Once | INTRAVENOUS | Status: AC
  Filled 2021-07-18: qty 20

## 2021-07-18 MED ORDER — LIDOCAINE VISCOUS HCL 2 % MT SOLN
15.0000 mL | Freq: Once | OROMUCOSAL | Status: AC
  Administered 2021-07-18: 15 mL via OROMUCOSAL
  Filled 2021-07-18: qty 15

## 2021-07-18 MED ORDER — LACTATED RINGERS IV BOLUS
1000.0000 mL | Freq: Once | INTRAVENOUS | Status: AC
  Administered 2021-07-18: 1000 mL via INTRAVENOUS

## 2021-07-18 MED ORDER — DILTIAZEM HCL-DEXTROSE 125-5 MG/125ML-% IV SOLN (PREMIX)
5.0000 mg/h | INTRAVENOUS | Status: DC
  Administered 2021-07-18: 5 mg/h via INTRAVENOUS
  Filled 2021-07-18: qty 125

## 2021-07-18 MED ORDER — PROPOFOL 10 MG/ML IV BOLUS
INTRAVENOUS | Status: AC
  Administered 2021-07-18: 54.5 mg via INTRAVENOUS
  Filled 2021-07-18: qty 20

## 2021-07-18 MED ORDER — DILTIAZEM HCL 25 MG/5ML IV SOLN
20.0000 mg | Freq: Once | INTRAVENOUS | Status: AC
  Administered 2021-07-18: 20 mg via INTRAVENOUS
  Filled 2021-07-18: qty 5

## 2021-07-18 MED ORDER — SODIUM CHLORIDE 0.9 % IV BOLUS
1000.0000 mL | Freq: Once | INTRAVENOUS | Status: AC
  Administered 2021-07-18: 1000 mL via INTRAVENOUS

## 2021-07-18 MED ORDER — LACTATED RINGERS IV BOLUS: 1000.0000 mL | Freq: Once | INTRAVENOUS | Status: DC

## 2021-07-18 NOTE — ED Triage Notes (Signed)
Pt has been experiencing reflux sx x 2 weeks which she states has started to give her the sensation of her heart "skipping a beat." She has a h/o afib (eliquis, no missed doses) which has felt similar in past which led her to seek emergency tx.  ?Denies SOB, CP, dizziness, fever.  ?Worse after eating.  ? ?H/o afib, autoimmune disorder, htn, asthma ?

## 2021-07-18 NOTE — ED Provider Notes (Signed)
?[facility] ?Provider Note ? ? ?CSN: [id number] ?Arrival date & time: 07/18/21  0046 ? ?  ? ?History ? ?Chief Complaint  ?Patient presents with  ? Palpitations  ? ? ?Rachel [name] is a 56 y.o. female. ? ?56 year old female with history of paroxysmal atrial fibrillation on Eliquis at home.  She states that she often has a fluttering indigestion feeling and then she will have some skipped beats and usually gets better on its own.  She states that tonight it happened and did not get any better so she presents here for further evaluation.  No real other associated symptoms.  States that she is been cardioverted before but she is also done better with diltiazem in the past.  She prefers that if possible ? ? ?Palpitations ? ?  ? ?Home Medications ?Prior to Admission medications   ?Medication Sig Start Date End Date Taking? Authorizing Provider  ?albuterol (PROVENTIL HFA;VENTOLIN HFA) 108 (90 Base) MCG/ACT inhaler Inhale 1-2 puffs into the lungs every 6 (six) hours as needed for wheezing or shortness of breath.   Yes [provider]  ?amLODipine (NORVASC) 10 MG tablet Take 10 mg by mouth daily. 06/25/21  Yes [provider]  ?apixaban (ELIQUIS) 5 MG TABS tablet Take 1 tablet (5 mg total) by mouth 2 (two) times daily. 06/08/17  Yes Rai, Ripudeep K, MD  ?atenolol (TENORMIN) 50 MG tablet Take 50 mg by mouth daily. 07/15/21  Yes [provider]  ?CALCIUM PO Take 1 tablet by mouth daily.   Yes [provider]  ?CALCIUM-MAGNESIUM-ZINC PO Take 1 tablet by mouth daily.   Yes [provider]  ?cholecalciferol (VITAMIN D) 1000 UNITS tablet Take 1,000 Units by mouth daily.   Yes [provider]  ?furosemide (LASIX) 20 MG tablet Take 20 mg by mouth daily. 06/25/21  Yes [provider]  ?mercaptopurine (PURINETHOL) 50 MG tablet Take 75 mg by mouth daily. Give on an empty stomach 1 hour before or 2 hours after meals. Caution: Chemotherapy.    Yes [provider]  ?montelukast (SINGULAIR) 10 MG tablet Take 10 mg by mouth at bedtime.   Yes [provider]  ?Multiple Vitamins-Minerals (ONE-A-DAY WOMENS PO) Take 1 tablet by mouth daily.   Yes [provider]  ?pantoprazole (PROTONIX) 20 MG tablet Take 20 mg by mouth daily. 07/15/21  Yes [provider]  ?Probiotic Product (DAILY PROBIOTIC PO) Take 1 tablet by mouth daily.   Yes [provider]  ?zinc gluconate 50 MG tablet Take 50 mg by mouth 2 (two) times a week.   Yes [provider]  ?ibuprofen (ADVIL,MOTRIN) 400 MG tablet Take 1 tablet (400 mg total) by mouth 2 (two) times daily. ?Patient not taking: Reported on 07/18/2021 02/02/18   Varney Biles, MD  ?metoprolol tartrate (LOPRESSOR) 25 MG tablet Take 1 tablet (25 mg total) by mouth 2 (two) times daily. ?Patient not taking: Reported on 07/18/2021 06/08/17   Mendel Corning, MD  ?oxyCODONE-acetaminophen (PERCOCET/ROXICET) 5-325 MG tablet Take 1 tablet by mouth every 8 (eight) hours as needed for severe pain. ?Patient not taking: Reported on 07/18/2021 02/02/18   Varney Biles, MD  ?   ? ?Allergies    ?Oxycodone-acetaminophen   ? ?Review of Systems   ?Review of Systems  ?Cardiovascular:  Positive for palpitations.  ? ?Physical Exam ?Updated Vital Signs ?BP 125/89   Pulse 70   Temp 98.8 ?F (37.1 ?C) (Oral)   Resp 18   Wt  108.9 kg   SpO2 97%   BMI 37.59 kg/m?  ?Physical Exam ?Vitals and nursing note reviewed.  ?Constitutional:   ?   Appearance: She is well-developed.  ?HENT:  ?   Head: Normocephalic and atraumatic.  ?   Nose: No congestion.  ?   Mouth/Throat:  ?   Mouth: Mucous membranes are moist.  ?   Pharynx: Oropharynx is clear.  ?Eyes:  ?   Pupils: Pupils are equal, round, and reactive to light.  ?Cardiovascular:  ?   Rate and Rhythm: Tachycardia present. Rhythm irregular.  ?Pulmonary:  ?   Effort: Pulmonary effort is normal. No respiratory distress.  ?   Breath sounds: No stridor.  ?Abdominal:   ?   General: Abdomen is flat. There is no distension.  ?Musculoskeletal:  ?   Cervical back: Normal range of motion.  ?Skin: ?   General: Skin is warm and dry.  ?Neurological:  ?   General: No focal deficit present.  ?   Mental Status: She is alert.  ?   Cranial Nerves: No cranial nerve deficit.  ?   Sensory: No sensory deficit.  ? ? ?ED Results / Procedures / Treatments   ?Labs ?(all labs ordered are listed, but only abnormal results are displayed) ?Labs Reviewed  ?BASIC METABOLIC PANEL - Abnormal; Notable for the following components:  ?    Result Value  ? Glucose, Bld 128 (*)   ? Creatinine, Ser 1.07 (*)   ? Calcium 10.4 (*)   ? All other components within normal limits  ?CBC - Abnormal; Notable for the following components:  ? RBC 5.36 (*)   ? Hemoglobin 15.6 (*)   ? HCT 46.9 (*)   ? All other components within normal limits  ?MAGNESIUM  ? ? ?EKG ?EKG Interpretation ? ?Date/Time:  Friday Jul 18 2021 00:58:43 EDT ?Ventricular Rate:  140 ?PR Interval:    ?QRS Duration: 68 ?QT Interval:  300 ?QTC Calculation: 458 ?R Axis:   54 ?Text Interpretation: Atrial fibrillation with rapid ventricular response Nonspecific T wave abnormality Abnormal ECG When compared with ECG of 01-Feb-2018 22:05, PREVIOUS ECG IS PRESENT Confirmed by Merrily Pew [phone]) on 07/18/2021 2:46:41 AM ? ?Radiology ?DG Chest 2 View ? ?Result Date: 07/18/2021 ?CLINICAL DATA:  Palpitations EXAM: CHEST - 2 VIEW COMPARISON:  06/07/2017 FINDINGS: The heart size and mediastinal contours are within normal limits. Both lungs are clear. The visualized skeletal structures are unremarkable. IMPRESSION: No active cardiopulmonary disease. Electronically Signed   By: Fidela Salisbury M.D.   On: 07/18/2021 01:29   ? ?Procedures ?Marland KitchenCritical Care ?Performed by: Merrily Pew, MD ?Authorized by: Merrily Pew, MD  ? ?Critical care provider statement:  ?  Critical care time (minutes):  32 ?  Critical care time was exclusive of:  Separately billable procedures and  treating other patients and teaching time ?  Critical care was necessary to treat or prevent imminent or life-threatening deterioration of the following conditions:  Circulatory failure ?  Critical care was time spent personally by me on the following activities:  Development of treatment plan with patient or surrogate, evaluation of patient's response to treatment, examination of patient, obtaining history from patient or surrogate, review of old charts, re-evaluation of patient's condition, pulse oximetry, ordering and performing treatments and interventions and ordering and review of laboratory studies ?.Cardioversion ? ?Date/Time: 07/19/2021 7:34 AM ?Performed by: Merrily Pew, MD ?Authorized by: Merrily Pew, MD  ? ?Consent:  ?  Consent obtained:  Written ?  Consent  given by:  Patient ?  Risks discussed:  Cutaneous burn, death, induced arrhythmia and pain ?  Alternatives discussed:  No treatment, anti-coagulation medication, delayed treatment, rate-control medication, observation, alternative treatment and referral ?Universal protocol:  ?  Procedure explained and questions answered to patient or proxy's satisfaction: yes   ?  Relevant documents present and verified: yes   ?Pre-procedure details:  ?  Cardioversion basis:  Emergent ?  Rhythm:  Atrial fibrillation ?  Electrode placement:  Anterior-posterior ?Patient sedated: Yes. Refer to sedation procedure documentation for details of sedation. ? ?Attempt one:  ?  Cardioversion mode:  Synchronous ?  Waveform:  Biphasic ?  Shock (Joules):  120 ?  Shock outcome:  Conversion to normal sinus rhythm ?Post-procedure details:  ?  Patient status:  Awake ?  Patient tolerance of procedure:  Tolerated well, no immediate complications ?.Sedation ? ?Date/Time: 07/19/2021 7:36 AM ?Performed by: Merrily Pew, MD ?Authorized by: Merrily Pew, MD  ? ?Consent:  ?  Consent obtained:  Written ?  Consent given by:  Patient ?  Risks discussed:  Inadequate sedation, dysrhythmia,  allergic reaction, prolonged hypoxia resulting in organ damage, respiratory compromise necessitating ventilatory assistance and intubation, vomiting and nausea ?  Alternatives discussed:  Analgesia without sedation ?[place]

## 2021-07-18 NOTE — ED Notes (Signed)
MD Mesner, [place] RN, [place] RN, [place], RT, and [place] NT at bedside  ?Consent signed at bedside  ?Propofol given at 0503 by by [place] RN  ?[phone] - pt remains alert and talking at this time  ?0505 - Additional 30 of Propofol given by [place] RN  ?[phone] - Pt remains alert and talking at this time  ?0507- Additional 20 of Propofol given by [place] RN  ?[phone] - Pt still awake and talking at this time  ?0509 - Pt shocked at this time - 120J ?EKG obtained - pt responding to verbal stimuli at this time ?3500 - Pt awake and talking at this time  ?

## 2021-08-18 ENCOUNTER — Ambulatory Visit
Admission: RE | Admit: 2021-08-18 | Discharge: 2021-08-18 | Disposition: A | Discharge status: Home / Self Care | Payer: BC Managed Care – PPO | Source: Ambulatory Visit | Attending: Obstetrics and Gynecology | Admitting: Obstetrics and Gynecology

## 2021-08-18 DIAGNOSIS — Z1231 Encounter for screening mammogram for malignant neoplasm of breast: Secondary | ICD-10-CM

## 2021-12-24 ENCOUNTER — Other Ambulatory Visit: Payer: Self-pay | Admitting: Internal Medicine

## 2021-12-24 DIAGNOSIS — E042 Nontoxic multinodular goiter: Secondary | ICD-10-CM

## 2021-12-25 ENCOUNTER — Ambulatory Visit
Admission: RE | Admit: 2021-12-25 | Discharge: 2021-12-25 | Disposition: A | Discharge status: Home / Self Care | Payer: BC Managed Care – PPO | Source: Ambulatory Visit | Attending: Internal Medicine | Admitting: Internal Medicine

## 2021-12-25 DIAGNOSIS — E042 Nontoxic multinodular goiter: Secondary | ICD-10-CM

## 2022-07-07 ENCOUNTER — Other Ambulatory Visit: Payer: Self-pay | Admitting: Registered Nurse

## 2022-07-07 DIAGNOSIS — Z1231 Encounter for screening mammogram for malignant neoplasm of breast: Secondary | ICD-10-CM

## 2022-08-21 ENCOUNTER — Ambulatory Visit
Admission: RE | Admit: 2022-08-21 | Discharge: 2022-08-21 | Disposition: A | Discharge status: Home / Self Care | Payer: No Typology Code available for payment source | Source: Ambulatory Visit | Attending: Registered Nurse | Admitting: Registered Nurse

## 2022-08-21 DIAGNOSIS — Z1231 Encounter for screening mammogram for malignant neoplasm of breast: Secondary | ICD-10-CM

## 2022-11-23 ENCOUNTER — Other Ambulatory Visit (HOSPITAL_COMMUNITY): Payer: Self-pay | Admitting: Gastroenterology

## 2022-11-23 DIAGNOSIS — R7989 Other specified abnormal findings of blood chemistry: Secondary | ICD-10-CM

## 2022-12-09 ENCOUNTER — Other Ambulatory Visit: Payer: Self-pay | Admitting: Radiology

## 2022-12-09 DIAGNOSIS — R7989 Other specified abnormal findings of blood chemistry: Secondary | ICD-10-CM

## 2022-12-10 ENCOUNTER — Other Ambulatory Visit: Payer: Self-pay

## 2022-12-10 ENCOUNTER — Ambulatory Visit (HOSPITAL_COMMUNITY)
Admission: RE | Admit: 2022-12-10 | Discharge: 2022-12-10 | Disposition: A | Discharge status: Home / Self Care | Payer: No Typology Code available for payment source | Source: Ambulatory Visit | Attending: Gastroenterology | Admitting: Gastroenterology

## 2022-12-10 DIAGNOSIS — K754 Autoimmune hepatitis: Secondary | ICD-10-CM | POA: Diagnosis not present

## 2022-12-10 DIAGNOSIS — R7989 Other specified abnormal findings of blood chemistry: Secondary | ICD-10-CM | POA: Diagnosis present

## 2022-12-10 LAB — CBC
HCT: 41.7 % (ref 36.0–46.0)
Hemoglobin: 13.7 g/dL (ref 12.0–15.0)
MCH: 28.9 pg (ref 26.0–34.0)
MCHC: 32.9 g/dL (ref 30.0–36.0)
MCV: 88 fL (ref 80.0–100.0)
Platelets: 252 10*3/uL (ref 150–400)
RBC: 4.74 MIL/uL (ref 3.87–5.11)
RDW: 16.2 % — ABNORMAL HIGH (ref 11.5–15.5)
WBC: 3.6 10*3/uL — ABNORMAL LOW (ref 4.0–10.5)
nRBC: 0 % (ref 0.0–0.2)

## 2022-12-10 LAB — PROTIME-INR
INR: 1.3 — ABNORMAL HIGH (ref 0.8–1.2)
Prothrombin Time: 16.5 s — ABNORMAL HIGH (ref 11.4–15.2)

## 2022-12-10 MED ORDER — GELATIN ABSORBABLE 12-7 MM EX MISC: 1.0000 | Freq: Once | CUTANEOUS | Status: DC

## 2022-12-10 MED ORDER — MIDAZOLAM HCL 2 MG/2ML IJ SOLN
INTRAMUSCULAR | Status: AC
  Filled 2022-12-10: qty 2

## 2022-12-10 MED ORDER — SODIUM CHLORIDE 0.9 % IV SOLN: INTRAVENOUS | Status: DC

## 2022-12-10 MED ORDER — FENTANYL CITRATE (PF) 100 MCG/2ML IJ SOLN
INTRAMUSCULAR | Status: AC | PRN
  Administered 2022-12-10: 50 ug via INTRAVENOUS

## 2022-12-10 MED ORDER — LIDOCAINE HCL (PF) 1 % IJ SOLN
10.0000 mL | Freq: Once | INTRAMUSCULAR | Status: AC
  Administered 2022-12-10: 10 mL via INTRADERMAL

## 2022-12-10 MED ORDER — FENTANYL CITRATE (PF) 100 MCG/2ML IJ SOLN
INTRAMUSCULAR | Status: AC
Start: 2022-12-10 — End: ?
  Filled 2022-12-10: qty 2

## 2022-12-10 MED ORDER — MIDAZOLAM HCL 2 MG/2ML IJ SOLN
INTRAMUSCULAR | Status: AC | PRN
  Administered 2022-12-10 (×2): .5 mg via INTRAVENOUS
  Administered 2022-12-10: 1 mg via INTRAVENOUS

## 2022-12-10 NOTE — Progress Notes (Signed)
Patient complaining of 7 out of 10 left side pain. PA called, with continue to monitor.

## 2022-12-10 NOTE — H&P (Signed)
Chief Complaint: Patient was seen in consultation today for liver core biopsy at the request of Outlaw,William  Referring Physician(s): Willis Modena  Supervising Physician: Roanna Banning  Patient Status: [facility] - Out-pt  History of Present Illness: Rachel [name] is a 57 y.o. female   FULL Code status per pt  Hx afib-- takes Eliquis  (LD 12/07/22) Hx autoimmune hepatitis 2018 Follows with Dr Dulce Sellar  Elevated liver function tests Scheduled now for liver biopsy per GI  Past Medical History:  Diagnosis Date   Abnormal pap    Abnormal Pap smear    Autoimmune hepatitis (HCC)    Gastroparesis    Hypertension    Thyroid disease    "nodule"    Past Surgical History:  Procedure Laterality Date   COLONOSCOPY     HERNIA REPAIR     JOINT REPLACEMENT     KNEE SURGERY     MYOMECTOMY     THROAT SURGERY      Allergies: Oxycodone-acetaminophen  Medications: Prior to Admission medications   Medication Sig Start Date End Date Taking? Authorizing Provider  albuterol (PROVENTIL HFA;VENTOLIN HFA) 108 (90 Base) MCG/ACT inhaler Inhale 1-2 puffs into the lungs every 6 (six) hours as needed for wheezing or shortness of breath.   Yes [provider]  amLODipine (NORVASC) 10 MG tablet Take 10 mg by mouth daily. 06/25/21  Yes [provider]  atenolol (TENORMIN) 50 MG tablet Take 50 mg by mouth daily. 07/15/21  Yes [provider]  CALCIUM PO Take 1 tablet by mouth daily.   Yes [provider]  CALCIUM-MAGNESIUM-ZINC PO Take 1 tablet by mouth daily.   Yes [provider]  cholecalciferol (VITAMIN D) 1000 UNITS tablet Take 1,000 Units by mouth daily.   Yes [provider]  furosemide (LASIX) 20 MG tablet Take 20 mg by mouth daily. 06/25/21  Yes [provider]  mercaptopurine (PURINETHOL) 50 MG tablet Take 75 mg by mouth daily. Give on an empty stomach 1 hour before or 2 hours after meals. Caution: Chemotherapy.   Yes [provider]  montelukast (SINGULAIR) 10 MG tablet Take 10 mg by mouth at bedtime.   Yes [provider]  Multiple Vitamins-Minerals (ONE-A-DAY WOMENS PO) Take 1 tablet by mouth daily.   Yes [provider]  pantoprazole (PROTONIX) 20 MG tablet Take 20 mg by mouth daily. 07/15/21  Yes [provider]  Probiotic Product (DAILY PROBIOTIC PO) Take 1 tablet by mouth daily.   Yes [provider]  zinc gluconate 50 MG tablet Take 50 mg by mouth 2 (two) times a week.   Yes [provider]  apixaban (ELIQUIS) 5 MG TABS tablet Take 1 tablet (5 mg total) by mouth 2 (two) times daily. 06/08/17   Rai, Delene Ruffini, MD  ibuprofen (ADVIL,MOTRIN) 400 MG tablet Take 1 tablet (400 mg total) by mouth 2 (two) times daily. Patient not taking: Reported on 07/18/2021 02/02/18   Derwood Kaplan, MD  metoprolol tartrate (LOPRESSOR) 25 MG tablet Take 1 tablet (25 mg total) by mouth 2 (two) times daily. Patient not taking: Reported on 07/18/2021 06/08/17   Rai, Delene Ruffini, MD  oxyCODONE-acetaminophen (PERCOCET/ROXICET) 5-325 MG tablet Take 1 tablet by mouth every 8 (eight) hours as needed for severe pain. Patient not taking: Reported on 07/18/2021 02/02/18   Derwood Kaplan, MD     Family History  Problem Relation Age of Onset   Heart disease Maternal Grandmother    Diabetes Maternal Grandmother  Colon cancer Father    Heart attack Mother        CAD, stents in her 16's   Diabetes Mother    Hypertension Brother    Multiple sclerosis Sister    Diabetes Sister    Hypertension Sister    Breast cancer Cousin        34s   Hypertension Brother     Social History   Socioeconomic History   Marital status: Married    Spouse name: Not on file   Number of children: Not on file   Years of education: Not on file   Highest education level: Not on file  Occupational History   Not on file  Tobacco Use   Smoking status: Never   Smokeless tobacco: Never  Substance and Sexual  Activity   Alcohol use: Yes   Drug use: No   Sexual activity: Yes    Partners: Male    Birth control/protection: I.U.D.    Comment: mirena  Other Topics Concern   Not on file  Social History Narrative   Not on file   Social Determinants of Health   Financial Resource Strain: Low Risk  (08/07/2022)   Received from [facility], [facility]   Overall Financial Resource Strain (CARDIA)    Difficulty of Paying Living Expenses: Not hard at all  Food Insecurity: No Food Insecurity (08/07/2022)   Received from [facility], [facility]   Hunger Vital Sign    Worried About Running Out of Food in the Last Year: Never true    Ran Out of Food in the Last Year: Never true  Transportation Needs: No Transportation Needs (08/07/2022)   Received from [facility], [facility]   PRAPARE - Transportation    Lack of Transportation (Medical): No    Lack of Transportation (Non-Medical): No  Physical Activity: Sufficiently Active (08/07/2022)   Received from [facility], [facility]   Exercise Vital Sign    Days of Exercise per Week: 4 days    Minutes of Exercise per Session: 40 min  Recent Concern: Physical Activity - Insufficiently Active (06/29/2022)   Received from [facility]   Exercise Vital Sign    Days of Exercise per Week: 4 days    Minutes of Exercise per Session: 30 min  Stress: No Stress Concern Present (08/07/2022)   Received from [facility], [facility] of Occupational [facility] - Occupational Stress Questionnaire    Feeling of Stress : Not at all  Social Connections: Moderately Integrated (08/07/2022)   Received from [facility], [facility]   Social Network    How would you rate your social network (family, work, friends)?: Adequate participation with social networks    Review of Systems: A 12 point ROS discussed and pertinent positives are indicated in the HPI above.  All other systems are negative.  Review of Systems  Constitutional:   Negative for activity change, fatigue and fever.  Respiratory:  Negative for cough and shortness of breath.   Cardiovascular:  Negative for chest pain.  Gastrointestinal:  Negative for abdominal pain, nausea and vomiting.  Psychiatric/Behavioral:  Negative for behavioral problems and confusion.     Vital Signs: BP 124/79   Pulse 65   Temp 98 F (36.7 C) (Oral)   Resp 16   Ht 5\' 7"  (1.702 m)   Wt 214 lb (97.1 kg)   SpO2 100%   BMI 33.52 kg/m     Physical Exam Vitals reviewed.  HENT:  Mouth/Throat:     Mouth: Mucous membranes are moist.  Cardiovascular:     Rate and Rhythm: Normal rate. Rhythm irregular.     Heart sounds: Normal heart sounds. No murmur heard. Pulmonary:     Effort: Pulmonary effort is normal.     Breath sounds: Normal breath sounds. No wheezing.  Abdominal:     Palpations: Abdomen is soft.     Tenderness: There is no abdominal tenderness.  Musculoskeletal:        General: Normal range of motion.  Skin:    General: Skin is warm.  Neurological:     Mental Status: She is alert and oriented to person, place, and time.  Psychiatric:        Behavior: Behavior normal.     Imaging: No results found.  Labs:  CBC: No results for input(s): "WBC", "HGB", "HCT", "PLT" in the last 8760 hours.  COAGS: No results for input(s): "INR", "APTT" in the last 8760 hours.  BMP: No results for input(s): "NA", "K", "CL", "CO2", "GLUCOSE", "BUN", "CALCIUM", "CREATININE", "GFRNONAA", "GFRAA" in the last 8760 hours.  Invalid input(s): "CMP"  LIVER FUNCTION TESTS: No results for input(s): "BILITOT", "AST", "ALT", "ALKPHOS", "PROT", "ALBUMIN" in the last 8760 hours.  TUMOR MARKERS: No results for input(s): "AFPTM", "CEA", "CA199", "CHROMGRNA" in the last 8760 hours.  Assessment and Plan:  Scheduled for liver core biopsy in IR Risks and benefits of liver core biopsy was discussed with the patient and/or patient's family including, but not limited to bleeding,  infection, damage to adjacent structures or low yield requiring additional tests.  All of the questions were answered and there is agreement to proceed.  Consent signed and in chart.  Thank you for this interesting consult.  I greatly enjoyed meeting [employer] and look forward to participating in their care.  A copy of this report was sent to the requesting provider on this date.  Electronically Signed: Robet Leu, PA-C 12/10/2022, 11:47 AM   I spent a total of  30 Minutes   in face to face in clinical consultation, greater than 50% of which was counseling/coordinating care for liver core biopsy

## 2022-12-10 NOTE — Procedures (Signed)
Vascular and Interventional Radiology Procedure Note  Patient: Rachel [name] DOB: 1965/05/07 Medical Record Number: [id number] Note Date/Time: 12/10/22 12:57 PM   Performing Physician: Roanna Banning, MD Assistant(s): None  Diagnosis: >LFTs. Hx ASMA Ab   Procedure: LIVER BIOPSY, NON TARGETED  Anesthesia: Conscious Sedation Complications: None Estimated Blood Loss: Minimal Specimens: Sent for Pathology  Findings:  Successful Ultrasound-guided biopsy of liver. A total of 3 samples were obtained. Hemostasis of the tract was achieved using Gelfoam Slurry Embolization.  Plan: Bed rest for 2 hours.  See detailed procedure note with images in PACS. The patient tolerated the procedure well without incident or complication and was returned to Recovery in stable condition.    Roanna Banning, MD [facility]   Pager. [phone] Clinic. [phone]

## 2022-12-10 NOTE — Progress Notes (Signed)
Patient and husband was given discharge instructions. Both verbalized understanding. 

## 2022-12-16 LAB — SURGICAL PATHOLOGY

## 2023-05-15 IMAGING — CR DG CHEST 2V
2 series · 2 of 2 positions shown · non-contrast
Comparison: 06/07/2017

CLINICAL DATA: Palpitations

EXAM:
CHEST - 2 VIEW

[chest pa]
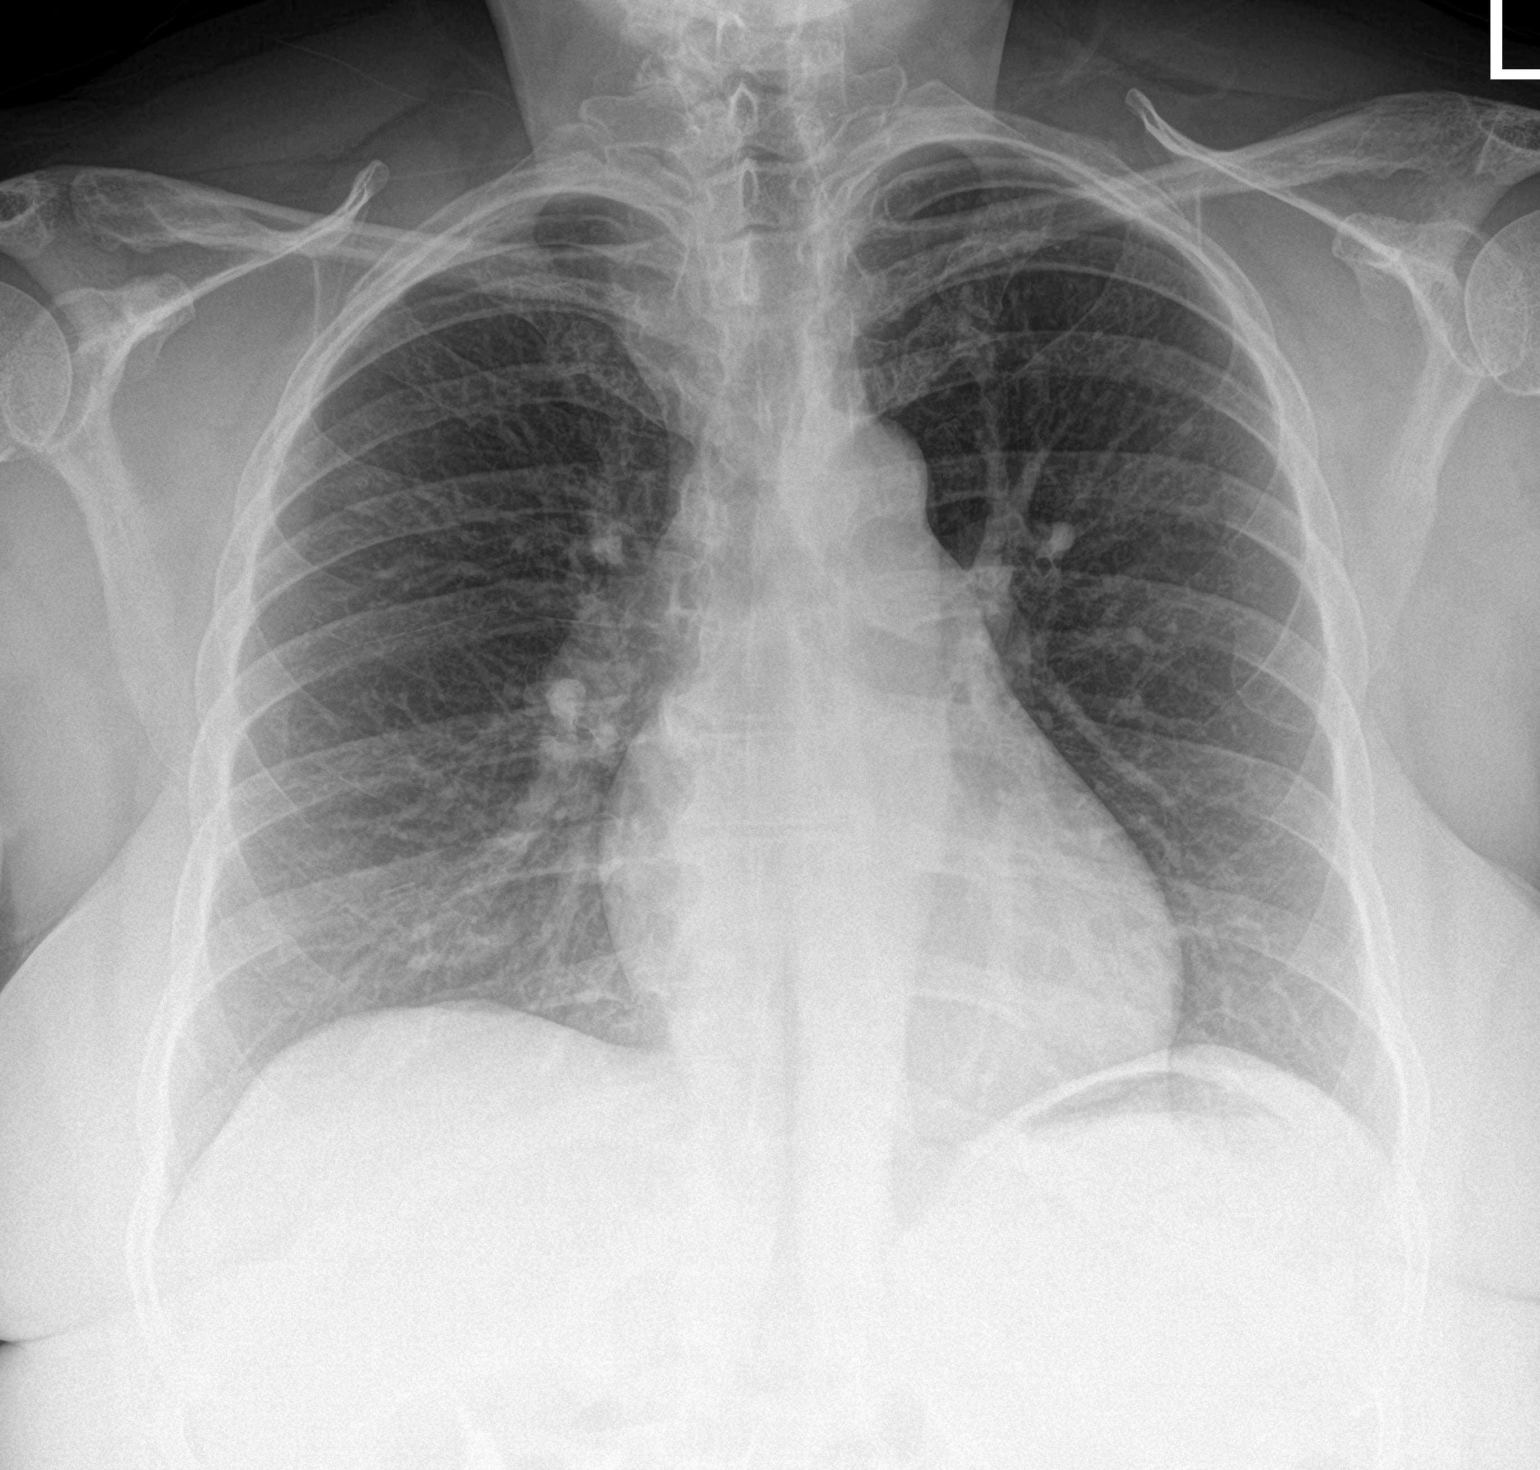

[chest lat]
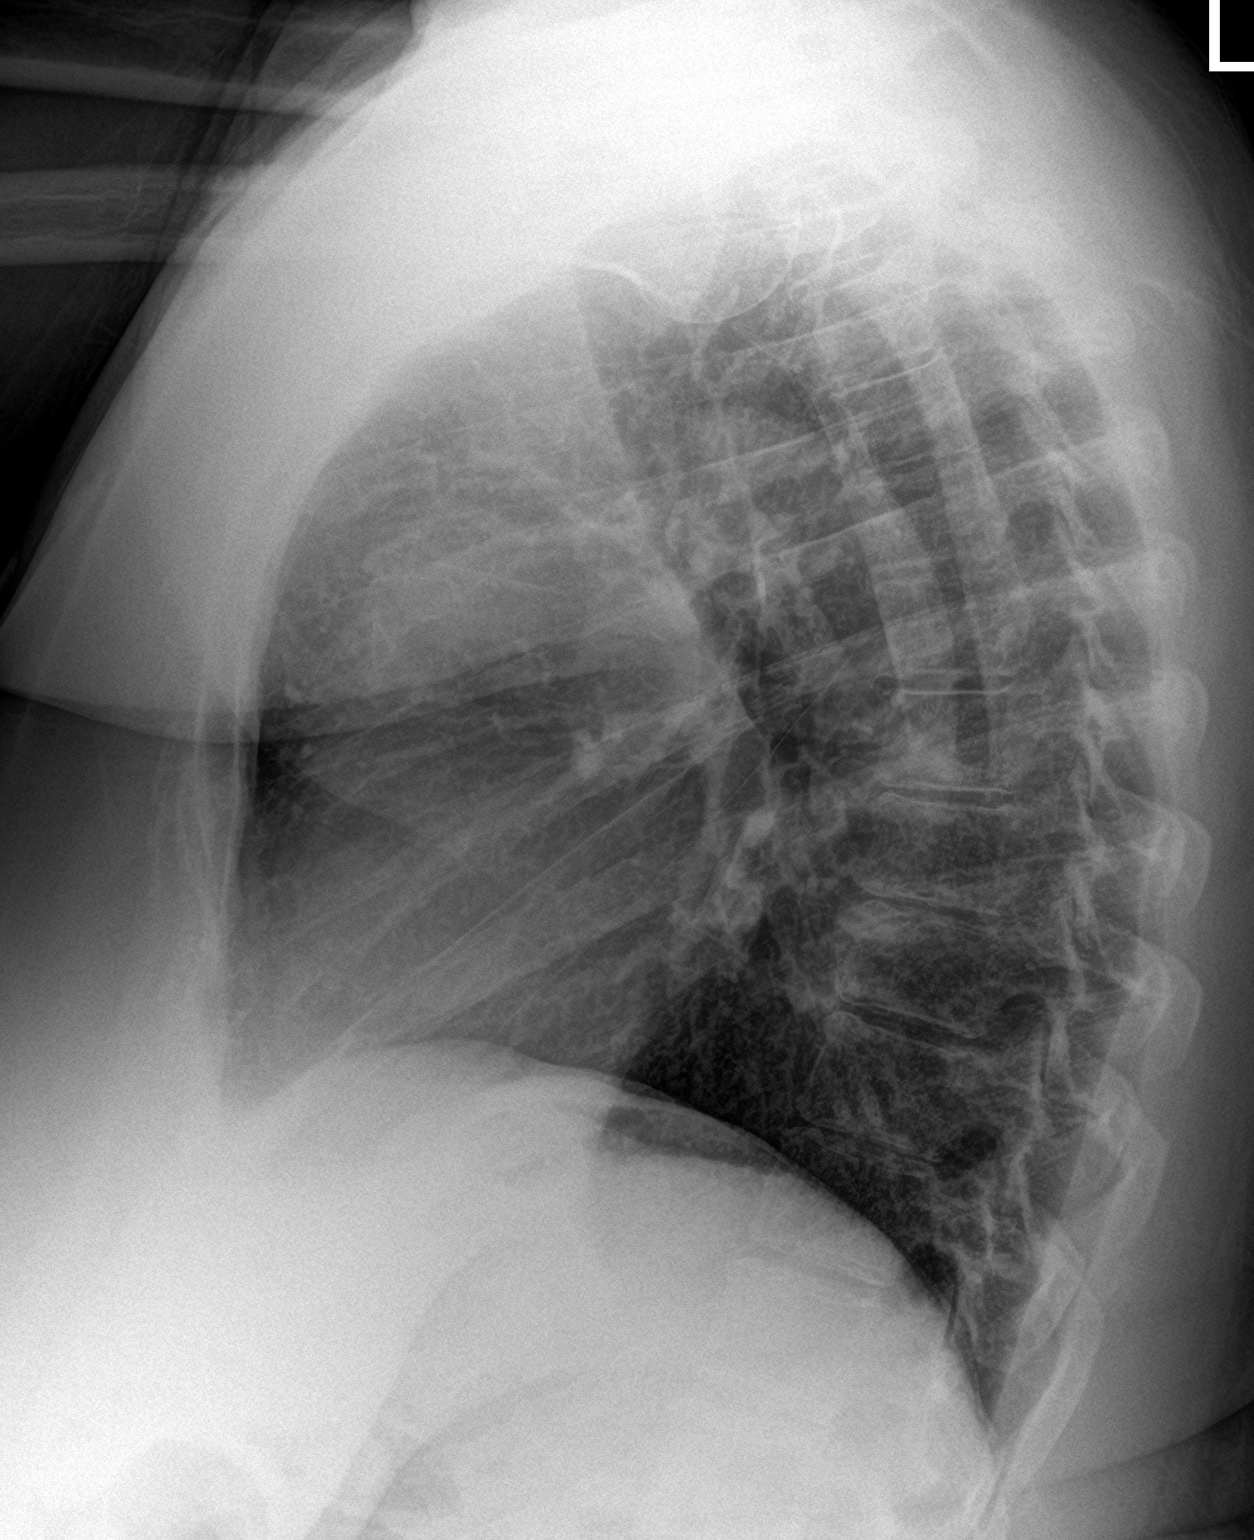

[2 of 2 positions shown; findings below may reference images not displayed]

FINDINGS: The heart size and mediastinal contours are within normal limits.
Both lungs are clear. The visualized skeletal structures are
unremarkable.
IMPRESSION: No active cardiopulmonary disease.

## 2023-07-08 ENCOUNTER — Other Ambulatory Visit: Payer: Self-pay | Admitting: Obstetrics and Gynecology

## 2023-07-08 DIAGNOSIS — Z1231 Encounter for screening mammogram for malignant neoplasm of breast: Secondary | ICD-10-CM

## 2023-08-24 ENCOUNTER — Ambulatory Visit
Admission: RE | Admit: 2023-08-24 | Discharge: 2023-08-24 | Disposition: A | Discharge status: Home / Self Care | Source: Ambulatory Visit | Attending: Obstetrics and Gynecology | Admitting: Obstetrics and Gynecology

## 2023-08-24 DIAGNOSIS — Z1231 Encounter for screening mammogram for malignant neoplasm of breast: Secondary | ICD-10-CM
